# Patient Record
Sex: Male | Born: 1960 | Race: White | Hispanic: No | Marital: Married | Smoking: Former smoker
Health system: Southern US, Community
[De-identification: ages and names within clinical notes are randomized; demographics above are authoritative.]

## PROBLEM LIST (undated history)

## (undated) DIAGNOSIS — E7800 Pure hypercholesterolemia, unspecified: Secondary | ICD-10-CM

## (undated) DIAGNOSIS — I10 Essential (primary) hypertension: Secondary | ICD-10-CM

## (undated) DIAGNOSIS — I471 Supraventricular tachycardia: Secondary | ICD-10-CM

---

## 2005-09-12 ENCOUNTER — Emergency Department: Payer: Self-pay | Admitting: Emergency Medicine

## 2005-09-12 ENCOUNTER — Other Ambulatory Visit: Payer: Self-pay

## 2005-09-23 ENCOUNTER — Ambulatory Visit: Payer: Self-pay | Admitting: Internal Medicine

## 2008-09-09 HISTORY — PX: ROTATOR CUFF REPAIR: SHX139

## 2010-07-14 ENCOUNTER — Emergency Department: Payer: Self-pay | Admitting: Unknown Physician Specialty

## 2011-06-27 ENCOUNTER — Ambulatory Visit: Payer: Self-pay | Admitting: Internal Medicine

## 2011-12-09 ENCOUNTER — Ambulatory Visit: Payer: Self-pay

## 2011-12-09 LAB — DOT URINE DIP
Blood: NEGATIVE
Glucose,UR: NEGATIVE mg/dL (ref 0–75)
Specific Gravity: 1.025 (ref 1.003–1.030)

## 2012-01-27 ENCOUNTER — Encounter (HOSPITAL_COMMUNITY): Payer: Self-pay

## 2012-01-27 ENCOUNTER — Emergency Department (HOSPITAL_COMMUNITY)
Admission: EM | Admit: 2012-01-27 | Discharge: 2012-01-27 | Disposition: A | Discharge status: Home / Self Care | Payer: BC Managed Care – PPO | Attending: Emergency Medicine | Admitting: Emergency Medicine

## 2012-01-27 DIAGNOSIS — I471 Supraventricular tachycardia: Secondary | ICD-10-CM

## 2012-01-27 DIAGNOSIS — Z7982 Long term (current) use of aspirin: Secondary | ICD-10-CM | POA: Insufficient documentation

## 2012-01-27 DIAGNOSIS — E789 Disorder of lipoprotein metabolism, unspecified: Secondary | ICD-10-CM | POA: Insufficient documentation

## 2012-01-27 DIAGNOSIS — Z79899 Other long term (current) drug therapy: Secondary | ICD-10-CM | POA: Insufficient documentation

## 2012-01-27 DIAGNOSIS — I10 Essential (primary) hypertension: Secondary | ICD-10-CM | POA: Insufficient documentation

## 2012-01-27 DIAGNOSIS — R002 Palpitations: Secondary | ICD-10-CM | POA: Insufficient documentation

## 2012-01-27 DIAGNOSIS — I498 Other specified cardiac arrhythmias: Secondary | ICD-10-CM | POA: Insufficient documentation

## 2012-01-27 HISTORY — DX: Pure hypercholesterolemia, unspecified: E78.00

## 2012-01-27 HISTORY — DX: Essential (primary) hypertension: I10

## 2012-01-27 HISTORY — DX: Supraventricular tachycardia: I47.1

## 2012-01-27 LAB — DIFFERENTIAL
Basophils Absolute: 0 10*3/uL (ref 0.0–0.1)
Lymphocytes Relative: 39 % (ref 12–46)
Monocytes Absolute: 0.5 10*3/uL (ref 0.1–1.0)
Neutro Abs: 3.8 10*3/uL (ref 1.7–7.7)

## 2012-01-27 LAB — BASIC METABOLIC PANEL
CO2: 25 mEq/L (ref 19–32)
Chloride: 106 mEq/L (ref 96–112)
Creatinine, Ser: 0.9 mg/dL (ref 0.50–1.35)
Potassium: 4.1 mEq/L (ref 3.5–5.1)
Sodium: 141 mEq/L (ref 135–145)

## 2012-01-27 LAB — CBC
HCT: 46.6 % (ref 39.0–52.0)
RDW: 13 % (ref 11.5–15.5)
WBC: 8.1 10*3/uL (ref 4.0–10.5)

## 2012-01-27 NOTE — ED Notes (Signed)
Pant is AOx4 and comfortable with his discharge instructions.

## 2012-01-27 NOTE — ED Provider Notes (Signed)
History     CSN: [id number]  Arrival date & time 01/27/12  2134   First MD Initiated Contact with Patient 01/27/12 2135      Chief Complaint  Patient presents with  . Tachycardia    (Consider location/radiation/quality/duration/timing/severity/associated sxs/prior treatment) Patient is a 51 y.o. male presenting with palpitations. The history is provided by the patient. No language interpreter was used.  Palpitations  This is a new problem. The current episode started 1 to 2 hours ago. The problem occurs constantly. The problem has been resolved. Pertinent negatives include no diaphoresis, no fever, no malaise/fatigue, no numbness, no chest pain, no chest pressure, no near-syncope, no orthopnea, no abdominal pain, no nausea, no vomiting, no headaches, no back pain, no leg pain, no dizziness, no weakness, no cough and no shortness of breath. He has tried nothing for the symptoms.    Past Medical History  Diagnosis Date  . SVT (supraventricular tachycardia)   . Hypercholesteremia   . Hypertension     Past Surgical History  Procedure Date  . Rotator cuff repair 2010    left    Family History  Problem Relation Age of Onset  . Hypertension Father     History  Substance Use Topics  . Smoking status: Former Smoker -- 3.0 packs/day for 25 years    Quit date: 02/27/1995  . Smokeless tobacco: Never Used  . Alcohol Use: 8.4 oz/week    14 Shots of liquor per week      Review of Systems  Constitutional: Negative for fever, malaise/fatigue, diaphoresis, activity change, appetite change and fatigue.  HENT: Negative for congestion, sore throat, rhinorrhea, neck pain and neck stiffness.   Respiratory: Negative for cough and shortness of breath.   Cardiovascular: Positive for palpitations. Negative for chest pain, orthopnea and near-syncope.  Gastrointestinal: Negative for nausea, vomiting and abdominal pain.  Genitourinary: Negative for dysuria, urgency, frequency and flank pain.   Musculoskeletal: Negative for myalgias, back pain and arthralgias.  Neurological: Negative for dizziness, weakness, light-headedness, numbness and headaches.  All other systems reviewed and are negative.    Allergies  Clindamycin/lincomycin; Penicillins; and Sulfa antibiotics  Home Medications   Current Outpatient Rx  Name Route Sig Dispense Refill  . ASPIRIN 81 MG PO CHEW Oral Chew 324 mg by mouth daily.    Marland Kitchen FLUTICASONE FUROATE 27.5 MCG/SPRAY NA SUSP Nasal Place 2 sprays into the nose daily.    Marland Kitchen METOPROLOL TARTRATE 25 MG PO TABS Oral Take 25 mg by mouth daily.    Marland Kitchen SIMVASTATIN 10 MG PO TABS Oral Take 10 mg by mouth at bedtime.      BP 141/111  Pulse 85  Temp(Src) 98.3 F (36.8 C) (Oral)  Resp 16  Ht 5\' 6"  (1.676 m)  Wt 185 lb (83.915 kg)  BMI 29.86 kg/m2  SpO2 97%  Physical Exam  Nursing note and vitals reviewed. Constitutional: He is oriented to person, place, and time. He appears well-developed and well-nourished. No distress.  HENT:  Head: Normocephalic and atraumatic.  Mouth/Throat: Oropharynx is clear and moist.  Eyes: Conjunctivae and EOM are normal. Pupils are equal, round, and reactive to light.  Neck: Normal range of motion. Neck supple.  Cardiovascular: Normal rate, regular rhythm, normal heart sounds and intact distal pulses.  Exam reveals no gallop and no friction rub.   No murmur heard. Pulmonary/Chest: Effort normal and breath sounds normal. No respiratory distress. He exhibits no tenderness.  Abdominal: Soft. Bowel sounds are normal. There is no tenderness. There  is no rebound and no guarding.  Musculoskeletal: Normal range of motion. He exhibits no tenderness.  Neurological: He is alert and oriented to person, place, and time. No cranial nerve deficit.  Skin: Skin is warm and dry. No rash noted.    ED Course  Procedures (including critical care time)   Date: 01/27/2012  Rate: 85  Rhythm: normal sinus rhythm  QRS Axis: normal  Intervals:  normal  ST/T Wave abnormalities: normal  Conduction Disutrbances:none  Narrative Interpretation:   Old EKG Reviewed: none available  Labs Reviewed  DIFFERENTIAL - Abnormal; Notable for the following:    Eosinophils Relative 7 (*)    All other components within normal limits  CBC  BASIC METABOLIC PANEL   No results found.   1. SVT (supraventricular tachycardia)       MDM  Supraventricular tachycardia which resolved using vagal maneuvers prior to arrival. History of same. Try to followup with primary care physician. The patient is not anemic and has no left leg about his. He will be discharged home with instructions to follow up with primary care physician. He is slightly hypertensive. Struck to followup with primary care physician to discuss further blood pressure control.        Dayton Bailiff, MD 01/27/12 2253

## 2012-01-27 NOTE — Discharge Instructions (Signed)
Supraventricular Tachycardia  Supraventricular tachycardia (SVT) is an abnormal heart rhythm (arrhythmia) that causes the heart to beat very fast (tachycardia). This kind of fast heartbeat originates in the upper chambers of the heart (atria). SVT can cause the heart to beat greater than 100 beats per minute. SVT can have a rapid burst of heartbeats. This can start and stop suddenly without warning and is called nonsustained. SVT can also be sustained, in which the heart beats at a continuous fast rate.   CAUSES   There can be different causes of SVT. Some of these include:   Heart valve problems such as mitral valve prolapse.   An enlarged heart (hypertrophic cardiomyopathy).   Congenital heart problems.   Heart inflammation (pericarditis).   Hyperthyroidism.   Low potassium or magnesium levels.   Caffeine.   Drug use such as cocaine, methamphetamines, or stimulants.   Some over-the-counter medicines such as:   Decongestants.   Diet medicines.   Herbal medicines.  SYMPTOMS   Symptoms of SVT can vary. Symptoms depend on whether the SVT is sustained or nonsustained. You may experience:   No symptoms (asymptomatic).   An awareness of your heart beating rapidly (palpitations).   Shortness of breath.   Chest pain or pressure.  If your blood pressure drops because of the SVT, you may experience:   Fainting or near fainting.   Weakness.   Dizziness.  DIAGNOSIS   Different tests can be performed to diagnose SVT, such as:   An electrocardiogram (EKG). This is a painless test that records the electrical activity of your heart.   Holter monitor. This is a 24 hour recording of your heart rhythm. You will be given a diary. Write down all symptoms that you have and what you were doing at the time you experienced symptoms.   Arrhythmia monitor. This is a small device that your wear for several weeks. It records the heart rhythm when you have symptoms.   Echocardiogram. This is an imaging test to help detect  abnormal heart structure such as congenital abnormalities, heart valve problems, or heart enlargement.   Stress test. This test can help determine if the SVT is related to exercise.   Electrophysiology study (EPS). This is a procedure that evaluates your heart's electrical system and can help your caregiver find the cause of your SVT.  TREATMENT   Treatment of SVT depends on the symptoms, how often it recurs, and whether there are any underlying heart problems.    If symptoms are rare and no other cardiac disease is present, no treatment may be needed.   Blood work may be done to check potassium, magnesium, and thyroid hormone levels to see if they are abnormal. If these levels are abnormal, treatment to correct the problems will occur.  Medicines  Your caregiver may use oral medicines to treat SVT. These medicines are given for long-term control of SVT. Medicines may be used alone or in combination with other treatments. These medicines work to slow nerve impulses in the heart muscle. These medicines can also be used to treat high blood pressure. Some of these medicines may include:   Calcium channel blockers.   Beta blockers.   Digoxin.  Nonsurgical procedures  Nonsurgical techniques may be used if oral medicines do not work. Some examples include:   Cardioversion. This technique uses either drugs or an electrical shock to restore a normal heart rhythm.   Cardioversion drugs may be given through an intravenous (IV) line to help "reset" the   heart rhythm.   In electrical cardioversion, the caregiver shocks your heart to stop its beat for a split second. This helps to reset the heart to a normal rhythm.   Ablation. This procedure is done under mild sedation. High frequency radio wave energy is used to destroy the area of heart tissue responsible for the SVT.  HOME CARE INSTRUCTIONS    Do not smoke.   Only take medicines prescribed by your caregiver. Check with your caregiver before using over-the-counter  medicines.   Check with your caregiver about how much alcohol and caffeine (coffee, tea, colas, or chocolate) you may have.   It is very important to keep all follow-up referrals and appointments in order to properly manage this problem.  SEEK IMMEDIATE MEDICAL CARE IF:   You have dizziness.   You faint or nearly faint.   You have shortness of breath.   You have chest pain or pressure.   You have sudden nausea or vomiting.   You have profuse sweating.   You are concerned about how long your symptoms last.   You are concerned about the frequency of your SVT episodes.  If you have the above symptoms, call your local emergency services (911 in [place] immediately. Do not drive yourself to the hospital.  MAKE SURE YOU:    Understand these instructions.   Will watch your condition.   Will get help right away if you are not doing well or get worse.  Document Released: 08/26/2005 Document Revised: 08/15/2011 Document Reviewed: 12/08/2008  [employer] Patient Information 2012 [employer]

## 2012-01-27 NOTE — ED Notes (Signed)
The patient states that he has had problems with SVT from time to time over the years.  Usually he can convert himself using the valsalva maneuver.  He has needed adenosine once in the past (4 years ago), but he has never needed to be cardioverted.  Today, he complained of palpitations, cold sweats, and 3/10 chest pressure prior to EMS arrival.  He was able to bear down and convert himself.  Post conversion, his chest pressure dissipated.

## 2017-02-18 ENCOUNTER — Emergency Department (HOSPITAL_COMMUNITY): Payer: Medicare HMO

## 2017-02-18 ENCOUNTER — Emergency Department (HOSPITAL_COMMUNITY)
Admission: EM | Admit: 2017-02-18 | Discharge: 2017-02-18 | Disposition: A | Discharge status: Home / Self Care | Payer: Medicare HMO | Attending: Emergency Medicine | Admitting: Emergency Medicine

## 2017-02-18 ENCOUNTER — Encounter (HOSPITAL_COMMUNITY): Payer: Self-pay

## 2017-02-18 DIAGNOSIS — F10120 Alcohol abuse with intoxication, uncomplicated: Secondary | ICD-10-CM | POA: Diagnosis not present

## 2017-02-18 DIAGNOSIS — R4182 Altered mental status, unspecified: Secondary | ICD-10-CM | POA: Diagnosis present

## 2017-02-18 DIAGNOSIS — Z79899 Other long term (current) drug therapy: Secondary | ICD-10-CM | POA: Insufficient documentation

## 2017-02-18 DIAGNOSIS — Z91040 Latex allergy status: Secondary | ICD-10-CM | POA: Diagnosis not present

## 2017-02-18 DIAGNOSIS — Z7984 Long term (current) use of oral hypoglycemic drugs: Secondary | ICD-10-CM | POA: Insufficient documentation

## 2017-02-18 DIAGNOSIS — Z87891 Personal history of nicotine dependence: Secondary | ICD-10-CM | POA: Diagnosis not present

## 2017-02-18 DIAGNOSIS — R791 Abnormal coagulation profile: Secondary | ICD-10-CM | POA: Insufficient documentation

## 2017-02-18 DIAGNOSIS — I10 Essential (primary) hypertension: Secondary | ICD-10-CM | POA: Diagnosis not present

## 2017-02-18 DIAGNOSIS — F10920 Alcohol use, unspecified with intoxication, uncomplicated: Secondary | ICD-10-CM

## 2017-02-18 LAB — COMPREHENSIVE METABOLIC PANEL
ALT: 40 U/L (ref 17–63)
ANION GAP: 11 (ref 5–15)
AST: 31 U/L (ref 15–41)
Albumin: 4.3 g/dL (ref 3.5–5.0)
Alkaline Phosphatase: 84 U/L (ref 38–126)
BILIRUBIN TOTAL: 0.8 mg/dL (ref 0.3–1.2)
BUN: 17 mg/dL (ref 6–20)
CO2: 19 mmol/L — ABNORMAL LOW (ref 22–32)
Calcium: 9 mg/dL (ref 8.9–10.3)
Chloride: 108 mmol/L (ref 101–111)
Creatinine, Ser: 0.77 mg/dL (ref 0.61–1.24)
GFR calc non Af Amer: >60 mL/min (ref >60)
GLUCOSE: 136 mg/dL — AB (ref 65–99)
POTASSIUM: 3.7 mmol/L (ref 3.5–5.1)
Sodium: 138 mmol/L (ref 135–145)
TOTAL PROTEIN: 7 g/dL (ref 6.5–8.1)

## 2017-02-18 LAB — RAPID URINE DRUG SCREEN, HOSP PERFORMED
AMPHETAMINES: NOT DETECTED
BARBITURATES: NOT DETECTED
BENZODIAZEPINES: NOT DETECTED
COCAINE: NOT DETECTED
OPIATES: NOT DETECTED
TETRAHYDROCANNABINOL: NOT DETECTED

## 2017-02-18 LAB — CBC WITH DIFFERENTIAL/PLATELET
Basophils Absolute: 0 10*3/uL (ref 0.0–0.1)
Basophils Relative: 1 %
EOS ABS: 0.3 10*3/uL (ref 0.0–0.7)
Eosinophils Relative: 3 %
HEMATOCRIT: 46.1 % (ref 39.0–52.0)
Hemoglobin: 15.4 g/dL (ref 13.0–17.0)
Lymphocytes Relative: 36 %
Lymphs Abs: 3 10*3/uL (ref 0.7–4.0)
MCH: 28.3 pg (ref 26.0–34.0)
MCHC: 33.4 g/dL (ref 30.0–36.0)
MCV: 84.7 fL (ref 78.0–100.0)
MONO ABS: 0.3 10*3/uL (ref 0.1–1.0)
MONOS PCT: 4 %
NEUTROS ABS: 4.5 10*3/uL (ref 1.7–7.7)
Neutrophils Relative %: 56 %
Platelets: 287 10*3/uL (ref 150–400)
RBC: 5.44 MIL/uL (ref 4.22–5.81)
RDW: 13.3 % (ref 11.5–15.5)
WBC: 8.1 10*3/uL (ref 4.0–10.5)

## 2017-02-18 LAB — URINALYSIS, ROUTINE W REFLEX MICROSCOPIC
BILIRUBIN URINE: NEGATIVE
Bacteria, UA: NONE SEEN
HGB URINE DIPSTICK: NEGATIVE
Ketones, ur: 5 mg/dL — AB
LEUKOCYTES UA: NEGATIVE
NITRITE: NEGATIVE
PH: 5 (ref 5.0–8.0)
Protein, ur: NEGATIVE mg/dL
SQUAMOUS EPITHELIAL / LPF: NONE SEEN
Specific Gravity, Urine: 1.028 (ref 1.005–1.030)

## 2017-02-18 LAB — CBG MONITORING, ED: Glucose-Capillary: 133 mg/dL — ABNORMAL HIGH (ref 65–99)

## 2017-02-18 LAB — I-STAT CG4 LACTIC ACID, ED: LACTIC ACID, VENOUS: 3.05 mmol/L — AB (ref 0.5–1.9)

## 2017-02-18 LAB — AMMONIA: Ammonia: 30 umol/L (ref 9–35)

## 2017-02-18 LAB — PROTIME-INR
INR: 0.95
Prothrombin Time: 12.7 seconds (ref 11.4–15.2)

## 2017-02-18 LAB — ETHANOL: Alcohol, Ethyl (B): 308 mg/dL (ref <5)

## 2017-02-18 MED ORDER — SODIUM CHLORIDE 0.9 % IV BOLUS (SEPSIS)
1000.0000 mL | Freq: Once | INTRAVENOUS | Status: AC
  Administered 2017-02-18: 1000 mL via INTRAVENOUS

## 2017-02-18 MED ORDER — ONDANSETRON HCL 4 MG/2ML IJ SOLN
4.0000 mg | Freq: Once | INTRAMUSCULAR | Status: AC
  Administered 2017-02-18: 4 mg via INTRAVENOUS
  Filled 2017-02-18: qty 2

## 2017-02-18 MED ORDER — NALOXONE HCL 0.4 MG/ML IJ SOLN
0.4000 mg | Freq: Once | INTRAMUSCULAR | Status: AC
  Administered 2017-02-18: 0.4 mg via INTRAVENOUS
  Filled 2017-02-18: qty 1

## 2017-02-18 NOTE — ED Notes (Signed)
Patient began vomiting. Family assisted patient to sitting position.

## 2017-02-18 NOTE — ED Triage Notes (Signed)
Pt brought in by [facility]. Pt here for altered mental status with LSN of 2230. Pt told his wife that he was going to take pain meds and drink a little. Pt wife woke up at 2330 to find pt sitting on floor non responsive. EMS administered 1/2 amp of d50 for BG of 103. Pt non responsive to treatment. Initial GCS of 7.

## 2017-02-18 NOTE — ED Notes (Signed)
Per other RN, patient sat up and began "tearing everything off". At that time patient was oriented x1-2. Reported no recollection of the evenings events, had no idea of where he was, and was mostly disoriented on time.

## 2017-02-18 NOTE — ED Provider Notes (Signed)
[facility]-EMERGENCY DEPT Provider Note   CSN: [id number] Arrival date & time: 02/18/17  0120   By signing my name below, I, Freida Busman, attest that this documentation has been prepared under the direction and in the presence of Pricilla Loveless, MD . Electronically Signed: Freida Busman, Scribe. 02/18/2017. 1:38 AM.   History   Chief Complaint Chief Complaint  Patient presents with  . Altered Mental Status   LEVEL 5 CAVEAT DUE TO AMS   The history is provided by the EMS personnel. No language interpreter was used.     HPI Comments:  Bryan Rowland is a 56 y.o. male who presents to the Emergency Department via EMS for AMS first noticed ~2230. Wife noted to EMS that the pt took an oxycodone for shoulder pain and then told her he was going to have a drink before bed; this is when he was last seen normal by wife. She woke up and found the pt sitting on the floor non-responsive; no signs of injury. EMS reports CBG of 102 and GCS of 7. He was given half an amp of D50 en route without change. Vitals have been stable.   Wife arrived later and notes that the patient has been acting like his normal self throughout the day today. Sometime between 9 and 10:00 tonight she saw [name] was acting normal. However he's been complaining of progressive acute on chronic pain in multiple joints. He was telling her he was going to go take his pain medicine and go to bed. She notes that he frequently will drink alcohol when he is in significant pain. At around 10 or 10:30 she could tell he had been drinking because is acting "silly". Sometime around midnight she heard a loud sound and found him sitting on the floor next to the bed. She thinks she is trying to get in or out of bed. She was able to coax him back into bed but he was very hard to arouse and so she called 911. Otherwise he has not been acting different or having fevers or infectious symptoms.    Past Medical History:  Diagnosis Date  .  Hypercholesteremia   . Hypertension   . SVT (supraventricular tachycardia) (HCC)     There are no active problems to display for this patient.   Past Surgical History:  Procedure Laterality Date  . ROTATOR CUFF REPAIR  2010   left       Home Medications    Prior to Admission medications   Medication Sig Start Date End Date Taking? Authorizing Provider  acetaminophen (TYLENOL) 500 MG tablet Take 500-1,000 mg by mouth every 6 (six) hours as needed for mild pain.   Yes [provider]  fluticasone (VERAMYST) 27.5 MCG/SPRAY nasal spray Place 2 sprays into the nose daily as needed for rhinitis or allergies.    Yes [provider]  glipiZIDE (GLUCOTROL) 5 MG tablet Take by mouth 2 (two) times daily before a meal.   Yes [provider]  ibuprofen (ADVIL,MOTRIN) 200 MG tablet Take 200-800 mg by mouth every 6 (six) hours as needed for moderate pain.    Yes [provider]  lisinopril (PRINIVIL,ZESTRIL) 10 MG tablet Take 10 mg by mouth daily.   Yes [provider]  metFORMIN (GLUCOPHAGE-XR) 500 MG 24 hr tablet Take 1,000 mg by mouth every evening.   Yes [provider]  ondansetron (ZOFRAN) 4 MG tablet Take 4 mg by mouth every 8 (eight) hours as needed for nausea or  vomiting.   Yes [provider]  oxyCODONE (OXY IR/ROXICODONE) 5 MG immediate release tablet Take 5 mg by mouth every 4 (four) hours as needed for severe pain.   Yes [provider]  pravastatin (PRAVACHOL) 20 MG tablet Take 20 mg by mouth daily.   Yes [provider]    Family History Family History  Problem Relation Age of Onset  . Hypertension Father     Social History Social History  Substance Use Topics  . Smoking status: Former Smoker    Packs/day: 3.00    Years: 25.00    Quit date: 02/27/1995  . Smokeless tobacco: Never Used  . Alcohol use 8.4 oz/week    14 Shots of liquor per week     Allergies   Penicillins;  Clindamycin/lincomycin; Sulfa antibiotics; and Latex   Review of Systems Review of Systems  Unable to perform ROS: Mental status change     Physical Exam Updated Vital Signs BP (!) 102/53   Pulse 75   Temp (!) 95.2 F (35.1 C) (Rectal)   Resp (!) 22   SpO2 100%   Physical Exam  Constitutional: He appears well-developed and well-nourished.  HENT:  Head: Normocephalic and atraumatic.  Right Ear: External ear normal.  Left Ear: External ear normal.  Nose: Nose normal.  Eyes: Pupils are equal, round, and reactive to light. Right eye exhibits no discharge. Left eye exhibits no discharge.  pupils midsized and reactive bilaterally  Neck: Neck supple.  Cardiovascular: Normal rate, regular rhythm and normal heart sounds.   Pulmonary/Chest: Effort normal and breath sounds normal.  Abdominal: Soft. There is no tenderness.  Musculoskeletal: He exhibits no edema.  Neurological:  Lethargic, mildly arouses to pain but does not follow commands Moves all 4 extremities spontaneously but unable to do strength testing due to AMS   Skin: Skin is warm and dry.  Nursing note and vitals reviewed.    ED Treatments / Results  DIAGNOSTIC STUDIES:  Oxygen Saturation is 99% on [place], normal by my interpretation.     Labs (all labs ordered are listed, but only abnormal results are displayed) Labs Reviewed  COMPREHENSIVE METABOLIC PANEL - Abnormal; Notable for the following:       Result Value   CO2 19 (*)    Glucose, Bld 136 (*)    All other components within normal limits  URINALYSIS, ROUTINE W REFLEX MICROSCOPIC - Abnormal; Notable for the following:    APPearance HAZY (*)    Glucose, UA >=500 (*)    Ketones, ur 5 (*)    All other components within normal limits  ETHANOL - Abnormal; Notable for the following:    Alcohol, Ethyl (B) 308 (*)    All other components within normal limits  CBG MONITORING, ED - Abnormal; Notable for the following:    Glucose-Capillary 133 (*)    All other  components within normal limits  I-STAT CG4 LACTIC ACID, ED - Abnormal; Notable for the following:    Lactic Acid, Venous 3.05 (*)    All other components within normal limits  CBC WITH DIFFERENTIAL/PLATELET  PROTIME-INR  AMMONIA  RAPID URINE DRUG SCREEN, HOSP PERFORMED    EKG  EKG Interpretation  Date/Time:  Tuesday February 18 2017 01:27:58 EDT Ventricular Rate:  72 PR Interval:    QRS Duration: 111 QT Interval:  387 QTC Calculation: 424 R Axis:   22 Text Interpretation:  Sinus rhythm Incomplete left bundle branch block no significant change since May 2013 Confirmed by Criss AlvineGoldston,  Brandalyn Harting [phone]) on 02/18/2017 1:42:52 AM       Radiology Dg Chest 1 View  Result Date: 02/18/2017 CLINICAL DATA:  Acute onset of altered mental status. Initial encounter. EXAM: CHEST 1 VIEW COMPARISON:  Chest radiograph performed 09/12/2005 FINDINGS: The lungs are well-aerated and clear. There is no evidence of focal opacification, pleural effusion or pneumothorax. The cardiomediastinal silhouette is mildly enlarged. No acute osseous abnormalities are seen. IMPRESSION: Mild cardiomegaly.  Lungs remain grossly clear. Electronically Signed   By: Roanna Raider M.D.   On: 02/18/2017 02:30   Ct Head Wo Contrast  Result Date: 02/18/2017 CLINICAL DATA:  Found down, alcohol intoxication. Last seen normal at 2200 hours. History of hypertension and hypercholesterolemia. EXAM: CT HEAD WITHOUT CONTRAST CT CERVICAL SPINE WITHOUT CONTRAST TECHNIQUE: Multidetector CT imaging of the head and cervical spine was performed following the standard protocol without intravenous contrast. Multiplanar CT image reconstructions of the cervical spine were also generated. COMPARISON:  MRI of the head Jan 30, 2012 FINDINGS: CT HEAD FINDINGS BRAIN: No intraparenchymal hemorrhage, mass effect nor midline shift. Mild prominence of ventricle and sulci for patient's age. No acute large vascular territory infarcts. Patchy RIGHT parietal  hypodensities stable from prior MRI given differences in imaging technique. No abnormal extra-axial fluid collections. Basal cisterns are patent. VASCULAR: Trace calcific atherosclerosis of the carotid siphons. SKULL/SOFT TISSUES: No skull fracture. No significant soft tissue swelling. ORBITS/SINUSES: The included ocular globes and orbital contents are normal.Moderate paranasal sinus mucosal thickening without air-fluid levels. The mastoid air cells are well aerated. OTHER: None. CT CERVICAL SPINE FINDINGS- moderately motion degraded examination. ALIGNMENT: Maintained lordosis. Vertebral bodies in alignment. SKULL BASE AND VERTEBRAE: Cervical vertebral bodies and posterior elements are intact. Severe is C3-4 disc height loss with uncovertebral hypertrophy and endplate spurring compatible with degenerative disc. No destructive bony lesions. C1-2 articulation maintained. SOFT TISSUES AND SPINAL CANAL: Normal. DISC LEVELS: No significant osseous canal stenosis. Moderate suspected C3-4, moderate to severe C5-6 neural foraminal narrowing. UPPER CHEST: Lung apices are clear. OTHER: None. IMPRESSION: CT HEAD: No acute intracranial process. Mild parenchymal brain volume loss for age. Stable focal RIGHT parietal chronic small vessel ischemic disease. CT CERVICAL SPINE: No acute fracture or malalignment on this motion degraded examination. Electronically Signed   By: Awilda Metro M.D.   On: 02/18/2017 02:40   Ct Cervical Spine Wo Contrast  Result Date: 02/18/2017 CLINICAL DATA:  Found down, alcohol intoxication. Last seen normal at 2200 hours. History of hypertension and hypercholesterolemia. EXAM: CT HEAD WITHOUT CONTRAST CT CERVICAL SPINE WITHOUT CONTRAST TECHNIQUE: Multidetector CT imaging of the head and cervical spine was performed following the standard protocol without intravenous contrast. Multiplanar CT image reconstructions of the cervical spine were also generated. COMPARISON:  MRI of the head Jan 30, 2012  FINDINGS: CT HEAD FINDINGS BRAIN: No intraparenchymal hemorrhage, mass effect nor midline shift. Mild prominence of ventricle and sulci for patient's age. No acute large vascular territory infarcts. Patchy RIGHT parietal hypodensities stable from prior MRI given differences in imaging technique. No abnormal extra-axial fluid collections. Basal cisterns are patent. VASCULAR: Trace calcific atherosclerosis of the carotid siphons. SKULL/SOFT TISSUES: No skull fracture. No significant soft tissue swelling. ORBITS/SINUSES: The included ocular globes and orbital contents are normal.Moderate paranasal sinus mucosal thickening without air-fluid levels. The mastoid air cells are well aerated. OTHER: None. CT CERVICAL SPINE FINDINGS- moderately motion degraded examination. ALIGNMENT: Maintained lordosis. Vertebral bodies in alignment. SKULL BASE AND VERTEBRAE: Cervical vertebral bodies and posterior elements are intact. Severe is C3-4  disc height loss with uncovertebral hypertrophy and endplate spurring compatible with degenerative disc. No destructive bony lesions. C1-2 articulation maintained. SOFT TISSUES AND SPINAL CANAL: Normal. DISC LEVELS: No significant osseous canal stenosis. Moderate suspected C3-4, moderate to severe C5-6 neural foraminal narrowing. UPPER CHEST: Lung apices are clear. OTHER: None. IMPRESSION: CT HEAD: No acute intracranial process. Mild parenchymal brain volume loss for age. Stable focal RIGHT parietal chronic small vessel ischemic disease. CT CERVICAL SPINE: No acute fracture or malalignment on this motion degraded examination. Electronically Signed   By: Awilda Metro M.D.   On: 02/18/2017 02:40    Procedures Procedures (including critical care time)  Medications Ordered in ED Medications  naloxone [facility]) injection 0.4 mg (0.4 mg Intravenous Given 02/18/17 0214)  sodium chloride 0.9 % bolus 1,000 mL (0 mLs Intravenous Stopped 02/18/17 0544)  sodium chloride 0.9 % bolus 1,000 mL (0  mLs Intravenous Stopped 02/18/17 0544)  ondansetron (ZOFRAN) injection 4 mg (4 mg Intravenous Given 02/18/17 0559)     Initial Impression / Assessment and Plan / ED Course  I have reviewed the triage vital signs and the nursing notes.  Pertinent labs & imaging results that were available during my care of the patient were reviewed by me and considered in my medical decision making (see chart for details).  Clinical Course as of Feb 18 958  Tue Feb 18, 2017  0140 patient has no focal neurologic deficits besides the acute change in mental status. Given the report of narcotic and alcohol use this is probably the cause. However, with such depressed mental status, will get emergent CT of head and C-spine as her could've been a fall. Given no focal neurologic deficits otherwise this is not indicative of a code stroke.  [SG]    Clinical Course User Index [SG] Pricilla Loveless, MD    Patient has progressively improved throughout the night. Son tells me he noticed a new 1/2 pint of vodka and whiskey gone tonight. Appears to have had severe ETOH intoxication. No evidence of trauma. Now coming back to himself. Still intoxicated. Patient is still unsteady on feet and needs assistance to walk. Discussed with wife/son, they are comfortable taking him home and helping him get to bed. He was hypothermic on arrival and lactate 3. This appears to be related to the acute ETOH. No signs/symptoms of acute infection.   Final Clinical Impressions(s) / ED Diagnoses   Final diagnoses:  Alcoholic intoxication without complication [facility])    New Prescriptions Discharge Medication List as of 02/18/2017  7:50 AM     I personally performed the services described in this documentation, which was scribed in my presence. The recorded information has been reviewed and is accurate.     Pricilla Loveless, MD 02/18/17 1000

## 2019-03-02 ENCOUNTER — Telehealth: Payer: Self-pay | Admitting: *Deleted

## 2019-03-02 NOTE — Telephone Encounter (Signed)
Called to review the [facility] program. LMOM 

## 2020-01-21 ENCOUNTER — Inpatient Hospital Stay (HOSPITAL_COMMUNITY)
Admission: EM | Admit: 2020-01-21 | Discharge: 2020-01-24 | DRG: 246 | Disposition: A | Discharge status: Home / Self Care | Payer: Medicare HMO | Attending: Internal Medicine | Admitting: Internal Medicine

## 2020-01-21 ENCOUNTER — Inpatient Hospital Stay (HOSPITAL_COMMUNITY)
Admission: EM | Disposition: A | Discharge status: Home / Self Care | Payer: Self-pay | Source: Home / Self Care | Attending: Internal Medicine

## 2020-01-21 ENCOUNTER — Ambulatory Visit (HOSPITAL_COMMUNITY): Admit: 2020-01-21 | Payer: Medicare HMO | Admitting: Internal Medicine

## 2020-01-21 DIAGNOSIS — E1159 Type 2 diabetes mellitus with other circulatory complications: Secondary | ICD-10-CM | POA: Diagnosis not present

## 2020-01-21 DIAGNOSIS — Z955 Presence of coronary angioplasty implant and graft: Secondary | ICD-10-CM | POA: Diagnosis not present

## 2020-01-21 DIAGNOSIS — I2121 ST elevation (STEMI) myocardial infarction involving left circumflex coronary artery: Secondary | ICD-10-CM | POA: Diagnosis present

## 2020-01-21 DIAGNOSIS — Z880 Allergy status to penicillin: Secondary | ICD-10-CM | POA: Diagnosis not present

## 2020-01-21 DIAGNOSIS — Z87891 Personal history of nicotine dependence: Secondary | ICD-10-CM | POA: Diagnosis not present

## 2020-01-21 DIAGNOSIS — Z7289 Other problems related to lifestyle: Secondary | ICD-10-CM

## 2020-01-21 DIAGNOSIS — I10 Essential (primary) hypertension: Secondary | ICD-10-CM | POA: Diagnosis present

## 2020-01-21 DIAGNOSIS — E119 Type 2 diabetes mellitus without complications: Secondary | ICD-10-CM | POA: Diagnosis present

## 2020-01-21 DIAGNOSIS — Z7951 Long term (current) use of inhaled steroids: Secondary | ICD-10-CM | POA: Diagnosis not present

## 2020-01-21 DIAGNOSIS — Z20822 Contact with and (suspected) exposure to covid-19: Secondary | ICD-10-CM | POA: Diagnosis present

## 2020-01-21 DIAGNOSIS — Z881 Allergy status to other antibiotic agents status: Secondary | ICD-10-CM | POA: Diagnosis not present

## 2020-01-21 DIAGNOSIS — Z79899 Other long term (current) drug therapy: Secondary | ICD-10-CM

## 2020-01-21 DIAGNOSIS — Y831 Surgical operation with implant of artificial internal device as the cause of abnormal reaction of the patient, or of later complication, without mention of misadventure at the time of the procedure: Secondary | ICD-10-CM | POA: Diagnosis present

## 2020-01-21 DIAGNOSIS — Z79890 Hormone replacement therapy (postmenopausal): Secondary | ICD-10-CM | POA: Diagnosis not present

## 2020-01-21 DIAGNOSIS — T82867A Thrombosis of cardiac prosthetic devices, implants and grafts, initial encounter: Principal | ICD-10-CM | POA: Diagnosis present

## 2020-01-21 DIAGNOSIS — Z91040 Latex allergy status: Secondary | ICD-10-CM | POA: Diagnosis not present

## 2020-01-21 DIAGNOSIS — I255 Ischemic cardiomyopathy: Secondary | ICD-10-CM | POA: Diagnosis present

## 2020-01-21 DIAGNOSIS — E782 Mixed hyperlipidemia: Secondary | ICD-10-CM | POA: Diagnosis present

## 2020-01-21 DIAGNOSIS — I2510 Atherosclerotic heart disease of native coronary artery without angina pectoris: Secondary | ICD-10-CM | POA: Diagnosis present

## 2020-01-21 DIAGNOSIS — I252 Old myocardial infarction: Secondary | ICD-10-CM | POA: Diagnosis not present

## 2020-01-21 DIAGNOSIS — Z882 Allergy status to sulfonamides status: Secondary | ICD-10-CM | POA: Diagnosis not present

## 2020-01-21 DIAGNOSIS — E785 Hyperlipidemia, unspecified: Secondary | ICD-10-CM

## 2020-01-21 DIAGNOSIS — I213 ST elevation (STEMI) myocardial infarction of unspecified site: Secondary | ICD-10-CM | POA: Diagnosis present

## 2020-01-21 DIAGNOSIS — E118 Type 2 diabetes mellitus with unspecified complications: Secondary | ICD-10-CM | POA: Diagnosis not present

## 2020-01-21 DIAGNOSIS — I25118 Atherosclerotic heart disease of native coronary artery with other forms of angina pectoris: Secondary | ICD-10-CM | POA: Diagnosis not present

## 2020-01-21 DIAGNOSIS — Z8249 Family history of ischemic heart disease and other diseases of the circulatory system: Secondary | ICD-10-CM | POA: Diagnosis not present

## 2020-01-21 HISTORY — PX: LEFT HEART CATH AND CORONARY ANGIOGRAPHY: CATH118249

## 2020-01-21 HISTORY — PX: INTRAVASCULAR ULTRASOUND/IVUS: CATH118244

## 2020-01-21 HISTORY — PX: CORONARY/GRAFT ACUTE MI REVASCULARIZATION: CATH118305

## 2020-01-21 LAB — APTT: aPTT: 29 seconds (ref 24–36)

## 2020-01-21 LAB — CBC
HCT: 52.9 % — ABNORMAL HIGH (ref 39.0–52.0)
Hemoglobin: 17.6 g/dL — ABNORMAL HIGH (ref 13.0–17.0)
MCH: 28.3 pg (ref 26.0–34.0)
MCHC: 33.3 g/dL (ref 30.0–36.0)
MCV: 85 fL (ref 80.0–100.0)
Platelets: 357 10*3/uL (ref 150–400)
RBC: 6.22 MIL/uL — ABNORMAL HIGH (ref 4.22–5.81)
RDW: 12.1 % (ref 11.5–15.5)
WBC: 16.3 10*3/uL — ABNORMAL HIGH (ref 4.0–10.5)
nRBC: 0 % (ref 0.0–0.2)

## 2020-01-21 LAB — POCT I-STAT 7, (LYTES, BLD GAS, ICA,H+H)
Acid-base deficit: 8 mmol/L — ABNORMAL HIGH (ref 0.0–2.0)
Bicarbonate: 18.1 mmol/L — ABNORMAL LOW (ref 20.0–28.0)
Calcium, Ion: 1.17 mmol/L (ref 1.15–1.40)
HCT: 51 % (ref 39.0–52.0)
Hemoglobin: 17.3 g/dL — ABNORMAL HIGH (ref 13.0–17.0)
O2 Saturation: 83 %
Potassium: 3.2 mmol/L — ABNORMAL LOW (ref 3.5–5.1)
Sodium: 133 mmol/L — ABNORMAL LOW (ref 135–145)
TCO2: 19 mmol/L — ABNORMAL LOW (ref 22–32)
pCO2 arterial: 39.3 mmHg (ref 32.0–48.0)
pH, Arterial: 7.272 — ABNORMAL LOW (ref 7.350–7.450)
pO2, Arterial: 53 mmHg — ABNORMAL LOW (ref 83.0–108.0)

## 2020-01-21 LAB — SARS CORONAVIRUS 2 BY RT PCR (HOSPITAL ORDER, PERFORMED IN ~~LOC~~ HOSPITAL LAB): SARS Coronavirus 2: NEGATIVE

## 2020-01-21 LAB — COMPREHENSIVE METABOLIC PANEL WITH GFR
ALT: 27 U/L (ref 0–44)
AST: 30 U/L (ref 15–41)
Albumin: 4.4 g/dL (ref 3.5–5.0)
Alkaline Phosphatase: 83 U/L (ref 38–126)
Anion gap: 19 — ABNORMAL HIGH (ref 5–15)
BUN: 29 mg/dL — ABNORMAL HIGH (ref 6–20)
CO2: 13 mmol/L — ABNORMAL LOW (ref 22–32)
Calcium: 9 mg/dL (ref 8.9–10.3)
Chloride: 105 mmol/L (ref 98–111)
Creatinine, Ser: 1.18 mg/dL (ref 0.61–1.24)
GFR calc Af Amer: >60 mL/min
GFR calc non Af Amer: >60 mL/min
Glucose, Bld: 215 mg/dL — ABNORMAL HIGH (ref 70–99)
Potassium: 4 mmol/L (ref 3.5–5.1)
Sodium: 137 mmol/L (ref 135–145)
Total Bilirubin: 1 mg/dL (ref 0.3–1.2)
Total Protein: 7.3 g/dL (ref 6.5–8.1)

## 2020-01-21 LAB — POCT ACTIVATED CLOTTING TIME
Activated Clotting Time: 230 seconds
Activated Clotting Time: 280 seconds
Activated Clotting Time: 268 seconds
Activated Clotting Time: 296 seconds

## 2020-01-21 LAB — LIPID PANEL
Cholesterol: 157 mg/dL (ref 0–200)
HDL: 72 mg/dL
LDL Cholesterol: 62 mg/dL (ref 0–99)
Total CHOL/HDL Ratio: 2.2 ratio
Triglycerides: 116 mg/dL
VLDL: 23 mg/dL (ref 0–40)

## 2020-01-21 LAB — HEMOGLOBIN A1C
Hgb A1c MFr Bld: 7.8 % — ABNORMAL HIGH (ref 4.8–5.6)
Mean Plasma Glucose: 177.16 mg/dL

## 2020-01-21 LAB — TROPONIN I (HIGH SENSITIVITY)
Troponin I (High Sensitivity): 11 ng/L (ref <18)
Troponin I (High Sensitivity): >27000 ng/L (ref <18)

## 2020-01-21 LAB — PROTIME-INR
INR: 1 (ref 0.8–1.2)
Prothrombin Time: 12.3 seconds (ref 11.4–15.2)

## 2020-01-21 LAB — POCT I-STAT, CHEM 8
BUN: 28 mg/dL — ABNORMAL HIGH (ref 6–20)
Calcium, Ion: 1.14 mmol/L — ABNORMAL LOW (ref 1.15–1.40)
Chloride: 108 mmol/L (ref 98–111)
Creatinine, Ser: 1 mg/dL (ref 0.61–1.24)
Glucose, Bld: 216 mg/dL — ABNORMAL HIGH (ref 70–99)
HCT: 54 % — ABNORMAL HIGH (ref 39.0–52.0)
Hemoglobin: 18.4 g/dL — ABNORMAL HIGH (ref 13.0–17.0)
Potassium: 3.7 mmol/L (ref 3.5–5.1)
Sodium: 139 mmol/L (ref 135–145)
TCO2: 16 mmol/L — ABNORMAL LOW (ref 22–32)

## 2020-01-21 LAB — GLUCOSE, CAPILLARY: Glucose-Capillary: 219 mg/dL — ABNORMAL HIGH (ref 70–99)

## 2020-01-21 SURGERY — CORONARY/GRAFT ACUTE MI REVASCULARIZATION
Anesthesia: LOCAL

## 2020-01-21 MED ORDER — NITROGLYCERIN 1 MG/10 ML FOR IR/CATH LAB
INTRA_ARTERIAL | Status: DC | PRN
  Administered 2020-01-21: 200 ug

## 2020-01-21 MED ORDER — VERAPAMIL HCL 2.5 MG/ML IV SOLN
INTRAVENOUS | Status: AC
  Filled 2020-01-21: qty 2

## 2020-01-21 MED ORDER — SODIUM CHLORIDE 0.9 % IV SOLN
INTRAVENOUS | Status: DC | PRN
  Administered 2020-01-21: 10 mL/h via INTRAVENOUS

## 2020-01-21 MED ORDER — HEPARIN SODIUM (PORCINE) 1000 UNIT/ML IJ SOLN
INTRAMUSCULAR | Status: AC
  Filled 2020-01-21: qty 1

## 2020-01-21 MED ORDER — IOHEXOL 350 MG/ML SOLN
INTRAVENOUS | Status: DC | PRN
  Administered 2020-01-21: 230 mL

## 2020-01-21 MED ORDER — HEPARIN (PORCINE) IN NACL 1000-0.9 UT/500ML-% IV SOLN
INTRAVENOUS | Status: AC
  Filled 2020-01-21: qty 500

## 2020-01-21 MED ORDER — LABETALOL HCL 5 MG/ML IV SOLN: 10.0000 mg | INTRAVENOUS | Status: AC | PRN

## 2020-01-21 MED ORDER — HEPARIN SODIUM (PORCINE) 1000 UNIT/ML IJ SOLN
INTRAMUSCULAR | Status: DC | PRN
  Administered 2020-01-21: 2000 [IU] via INTRAVENOUS
  Administered 2020-01-21: 3000 [IU] via INTRAVENOUS
  Administered 2020-01-21: 7000 [IU] via INTRAVENOUS
  Administered 2020-01-21: 3000 [IU] via INTRAVENOUS

## 2020-01-21 MED ORDER — VERAPAMIL HCL 2.5 MG/ML IV SOLN
INTRAVENOUS | Status: DC | PRN
  Administered 2020-01-21: 10 mL via INTRA_ARTERIAL

## 2020-01-21 MED ORDER — HEPARIN (PORCINE) IN NACL 1000-0.9 UT/500ML-% IV SOLN
INTRAVENOUS | Status: DC | PRN
  Administered 2020-01-21 (×2): 500 mL

## 2020-01-21 MED ORDER — LIDOCAINE HCL (PF) 1 % IJ SOLN
INTRAMUSCULAR | Status: AC
  Filled 2020-01-21: qty 30

## 2020-01-21 MED ORDER — ONDANSETRON HCL 4 MG/2ML IJ SOLN: 4.0000 mg | Freq: Four times a day (QID) | INTRAMUSCULAR | Status: DC | PRN

## 2020-01-21 MED ORDER — ENOXAPARIN SODIUM 40 MG/0.4ML ~~LOC~~ SOLN
40.0000 mg | SUBCUTANEOUS | Status: DC
  Administered 2020-01-22 – 2020-01-23 (×2): 40 mg via SUBCUTANEOUS
  Filled 2020-01-21 (×3): qty 0.4

## 2020-01-21 MED ORDER — METOPROLOL SUCCINATE 12.5 MG HALF TABLET
12.5000 mg | ORAL_TABLET | Freq: Every day | ORAL | Status: DC
  Administered 2020-01-21 – 2020-01-22 (×2): 12.5 mg via ORAL
  Filled 2020-01-21 (×2): qty 1

## 2020-01-21 MED ORDER — FUROSEMIDE 10 MG/ML IJ SOLN
INTRAMUSCULAR | Status: AC
  Filled 2020-01-21: qty 4

## 2020-01-21 MED ORDER — FUROSEMIDE 10 MG/ML IJ SOLN
INTRAMUSCULAR | Status: DC | PRN
  Administered 2020-01-21: 20 mg via INTRAVENOUS

## 2020-01-21 MED ORDER — SODIUM CHLORIDE 0.9 % IV SOLN: INTRAVENOUS | Status: AC

## 2020-01-21 MED ORDER — MIDAZOLAM HCL 2 MG/2ML IJ SOLN
INTRAMUSCULAR | Status: DC | PRN
  Administered 2020-01-21 (×2): 1 mg via INTRAVENOUS

## 2020-01-21 MED ORDER — IOHEXOL 350 MG/ML SOLN
INTRAVENOUS | Status: AC
  Filled 2020-01-21: qty 1

## 2020-01-21 MED ORDER — ATORVASTATIN CALCIUM 80 MG PO TABS
80.0000 mg | ORAL_TABLET | Freq: Every day | ORAL | Status: DC
  Administered 2020-01-21 – 2020-01-24 (×4): 80 mg via ORAL
  Filled 2020-01-21 (×4): qty 1

## 2020-01-21 MED ORDER — CLOPIDOGREL BISULFATE 75 MG PO TABS
75.0000 mg | ORAL_TABLET | Freq: Every day | ORAL | Status: DC
  Administered 2020-01-22 – 2020-01-24 (×3): 75 mg via ORAL
  Filled 2020-01-21 (×3): qty 1

## 2020-01-21 MED ORDER — ASPIRIN 81 MG PO CHEW
81.0000 mg | CHEWABLE_TABLET | Freq: Every day | ORAL | Status: DC
  Administered 2020-01-22 – 2020-01-24 (×3): 81 mg via ORAL
  Filled 2020-01-21 (×3): qty 1

## 2020-01-21 MED ORDER — SODIUM CHLORIDE 0.9 % IV SOLN: 250.0000 mL | INTRAVENOUS | Status: DC | PRN

## 2020-01-21 MED ORDER — MIDAZOLAM HCL 2 MG/2ML IJ SOLN
INTRAMUSCULAR | Status: AC
  Filled 2020-01-21: qty 2

## 2020-01-21 MED ORDER — NITROGLYCERIN 1 MG/10 ML FOR IR/CATH LAB
INTRA_ARTERIAL | Status: AC
  Filled 2020-01-21: qty 10

## 2020-01-21 MED ORDER — INSULIN ASPART 100 UNIT/ML ~~LOC~~ SOLN
0.0000 [IU] | Freq: Every day | SUBCUTANEOUS | Status: DC
  Administered 2020-01-21: 2 [IU] via SUBCUTANEOUS

## 2020-01-21 MED ORDER — CLOPIDOGREL BISULFATE 300 MG PO TABS
ORAL_TABLET | ORAL | Status: DC | PRN
  Administered 2020-01-21: 300 mg via ORAL

## 2020-01-21 MED ORDER — SODIUM CHLORIDE 0.9% FLUSH
3.0000 mL | Freq: Two times a day (BID) | INTRAVENOUS | Status: DC
  Administered 2020-01-21 – 2020-01-24 (×5): 3 mL via INTRAVENOUS

## 2020-01-21 MED ORDER — ACETAMINOPHEN 325 MG PO TABS
650.0000 mg | ORAL_TABLET | ORAL | Status: DC | PRN
  Administered 2020-01-22: 650 mg via ORAL
  Filled 2020-01-21: qty 2

## 2020-01-21 MED ORDER — SODIUM CHLORIDE 0.9 % IV SOLN
INTRAVENOUS | Status: AC | PRN
  Administered 2020-01-21: 10 mL/h via INTRAVENOUS

## 2020-01-21 MED ORDER — HYDRALAZINE HCL 20 MG/ML IJ SOLN: 10.0000 mg | INTRAMUSCULAR | Status: AC | PRN

## 2020-01-21 MED ORDER — ORAL CARE MOUTH RINSE
15.0000 mL | Freq: Two times a day (BID) | OROMUCOSAL | Status: DC
  Administered 2020-01-22 – 2020-01-23 (×3): 15 mL via OROMUCOSAL

## 2020-01-21 MED ORDER — FENTANYL CITRATE (PF) 100 MCG/2ML IJ SOLN
INTRAMUSCULAR | Status: AC
  Filled 2020-01-21: qty 2

## 2020-01-21 MED ORDER — ALUM & MAG HYDROXIDE-SIMETH 200-200-20 MG/5ML PO SUSP
30.0000 mL | ORAL | Status: DC | PRN
  Administered 2020-01-21: 30 mL via ORAL
  Filled 2020-01-21: qty 30

## 2020-01-21 MED ORDER — FENTANYL CITRATE (PF) 100 MCG/2ML IJ SOLN
INTRAMUSCULAR | Status: DC | PRN
  Administered 2020-01-21 (×2): 25 ug via INTRAVENOUS

## 2020-01-21 MED ORDER — INSULIN ASPART 100 UNIT/ML ~~LOC~~ SOLN
0.0000 [IU] | Freq: Three times a day (TID) | SUBCUTANEOUS | Status: DC
  Administered 2020-01-22: 5 [IU] via SUBCUTANEOUS
  Administered 2020-01-22: 8 [IU] via SUBCUTANEOUS
  Administered 2020-01-22: 5 [IU] via SUBCUTANEOUS
  Administered 2020-01-23 – 2020-01-24 (×4): 3 [IU] via SUBCUTANEOUS

## 2020-01-21 MED ORDER — SODIUM CHLORIDE 0.9% FLUSH: 3.0000 mL | INTRAVENOUS | Status: DC | PRN

## 2020-01-21 SURGICAL SUPPLY — 28 items
BALLN SAPPHIRE 2.0X12 (BALLOONS) ×2
BALLN SAPPHIRE 2.5X12 (BALLOONS) ×2
BALLN SAPPHIRE ~~LOC~~ 2.25X12 (BALLOONS) ×2 IMPLANT
BALLN SAPPHIRE ~~LOC~~ 3.0X12 (BALLOONS) ×2 IMPLANT
BALLN SAPPHIRE ~~LOC~~ 3.5X15 (BALLOONS) ×2 IMPLANT
BALLN SAPPHIRE ~~LOC~~ 4.0X12 (BALLOONS) ×2 IMPLANT
BALLN ~~LOC~~ EMERGE MR 3.5X20 (BALLOONS) ×2
BALLOON SAPPHIRE 2.0X12 (BALLOONS) ×1 IMPLANT
BALLOON SAPPHIRE 2.5X12 (BALLOONS) ×1 IMPLANT
BALLOON ~~LOC~~ EMERGE MR 3.5X20 (BALLOONS) ×1 IMPLANT
CATH 5FR JL3.5 JR4 ANG PIG MP (CATHETERS) ×2 IMPLANT
CATH LAUNCHER 6FR EBU3.5 (CATHETERS) ×2 IMPLANT
CATH OPTICROSS HD (CATHETERS) ×2 IMPLANT
DEVICE RAD COMP TR BAND LRG (VASCULAR PRODUCTS) ×2 IMPLANT
GLIDESHEATH SLEND SS 6F .021 (SHEATH) ×2 IMPLANT
GUIDEWIRE INQWIRE 1.5J.035X260 (WIRE) ×1 IMPLANT
INQWIRE 1.5J .035X260CM (WIRE) ×2
KIT ENCORE 26 ADVANTAGE (KITS) ×4 IMPLANT
KIT HEART LEFT (KITS) ×2 IMPLANT
PACK CARDIAC CATHETERIZATION (CUSTOM PROCEDURE TRAY) ×2 IMPLANT
SLED PULL BACK IVUS (MISCELLANEOUS) ×2 IMPLANT
STENT SYNERGY XD 3.50X20 (Permanent Stent) ×1 IMPLANT
SYNERGY XD 3.50X20 (Permanent Stent) ×2 IMPLANT
TRANSDUCER W/STOPCOCK (MISCELLANEOUS) ×2 IMPLANT
TUBING CIL FLEX 10 FLL-RA (TUBING) ×2 IMPLANT
WIRE ASAHI PROWATER 180CM (WIRE) ×2 IMPLANT
WIRE HI TORQ WHISPER MS 190CM (WIRE) ×2 IMPLANT
WIRE RUNTHROUGH .014X180CM (WIRE) ×2 IMPLANT
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
## 2020-01-22 ENCOUNTER — Inpatient Hospital Stay (HOSPITAL_COMMUNITY): Payer: Medicare HMO

## 2020-01-22 DIAGNOSIS — I2121 ST elevation (STEMI) myocardial infarction involving left circumflex coronary artery: Secondary | ICD-10-CM

## 2020-01-22 LAB — BASIC METABOLIC PANEL
Anion gap: 13 (ref 5–15)
BUN: 26 mg/dL — ABNORMAL HIGH (ref 6–20)
CO2: 19 mmol/L — ABNORMAL LOW (ref 22–32)
Calcium: 8.9 mg/dL (ref 8.9–10.3)
Chloride: 102 mmol/L (ref 98–111)
Creatinine, Ser: 1.2 mg/dL (ref 0.61–1.24)
GFR calc Af Amer: >60 mL/min (ref >60)
GFR calc non Af Amer: >60 mL/min (ref >60)
Glucose, Bld: 188 mg/dL — ABNORMAL HIGH (ref 70–99)
Potassium: 4.4 mmol/L (ref 3.5–5.1)
Sodium: 134 mmol/L — ABNORMAL LOW (ref 135–145)

## 2020-01-22 LAB — ECHOCARDIOGRAM COMPLETE

## 2020-01-22 LAB — CBC
HCT: 52.3 % — ABNORMAL HIGH (ref 39.0–52.0)
Hemoglobin: 16.9 g/dL (ref 13.0–17.0)
MCH: 27.9 pg (ref 26.0–34.0)
MCHC: 32.3 g/dL (ref 30.0–36.0)
MCV: 86.4 fL (ref 80.0–100.0)
Platelets: 331 10*3/uL (ref 150–400)
RBC: 6.05 MIL/uL — ABNORMAL HIGH (ref 4.22–5.81)
RDW: 12.6 % (ref 11.5–15.5)
WBC: 16.6 10*3/uL — ABNORMAL HIGH (ref 4.0–10.5)
nRBC: 0 % (ref 0.0–0.2)

## 2020-01-22 LAB — TROPONIN I (HIGH SENSITIVITY)
Troponin I (High Sensitivity): >27000 ng/L (ref <18)
Troponin I (High Sensitivity): >27000 ng/L (ref <18)

## 2020-01-22 LAB — GLUCOSE, CAPILLARY
Glucose-Capillary: 205 mg/dL — ABNORMAL HIGH (ref 70–99)
Glucose-Capillary: 289 mg/dL — ABNORMAL HIGH (ref 70–99)
Glucose-Capillary: 245 mg/dL — ABNORMAL HIGH (ref 70–99)
Glucose-Capillary: 152 mg/dL — ABNORMAL HIGH (ref 70–99)

## 2020-01-22 LAB — MRSA PCR SCREENING: MRSA by PCR: NEGATIVE

## 2020-01-22 MED ORDER — CHLORHEXIDINE GLUCONATE CLOTH 2 % EX PADS
6.0000 | MEDICATED_PAD | Freq: Every day | CUTANEOUS | Status: DC
  Administered 2020-01-22: 6 via TOPICAL

## 2020-01-22 MED ORDER — LOSARTAN POTASSIUM 25 MG PO TABS
12.5000 mg | ORAL_TABLET | Freq: Every day | ORAL | Status: DC
  Administered 2020-01-22 – 2020-01-24 (×3): 12.5 mg via ORAL
  Filled 2020-01-22 (×3): qty 1

## 2020-01-22 MED ORDER — METOPROLOL TARTRATE 12.5 MG HALF TABLET
12.5000 mg | ORAL_TABLET | Freq: Once | ORAL | Status: AC
  Administered 2020-01-22: 12.5 mg via ORAL
  Filled 2020-01-22: qty 1

## 2020-01-22 MED ORDER — NITROGLYCERIN 0.4 MG SL SUBL
0.4000 mg | SUBLINGUAL_TABLET | SUBLINGUAL | Status: DC | PRN
  Administered 2020-01-22 (×4): 0.4 mg via SUBLINGUAL
  Filled 2020-01-22 (×2): qty 1

## 2020-01-22 NOTE — Progress Notes (Signed)
CARDIAC REHAB PHASE I   MI ed completed with pt. Pt educated on importance of ASA and Plavix. Pt states he has been out of Plavix for one week, confirmed by wife. Pt given MI book along with heart healthy and diabetic diets. Reviewed site care and restrictions. Held ambulation as pt recently received lunch and is having some residual CP, 2/10 after tylenol. Pt waiting on echo. Will refer to CRP II  At [facility], pt states he is not interested in attending as he does not have time. Will f/u Monday if pt remains in hospital.  [id number] Reynold Bowen, RN BSN 01/22/2020 12:00 PM

## 2020-01-22 NOTE — Progress Notes (Signed)
Progress Note  Patient Name: Bryan Rowland Date of Encounter: 01/22/2020  Primary Cardiologist:  [facility]  Subjective   Sitting in bedside chair.  States that he had some recurrent chest discomfort this morning with diaphoresis, resolved.  No palpitations or breathlessness.  Inpatient Medications    Scheduled Meds: . aspirin  81 mg Oral Daily  . atorvastatin  80 mg Oral Daily  . Chlorhexidine Gluconate Cloth  6 each Topical Daily  . clopidogrel  75 mg Oral Q breakfast  . enoxaparin (LOVENOX) injection  40 mg Subcutaneous Q24H  . insulin aspart  0-15 Units Subcutaneous TID WC  . insulin aspart  0-5 Units Subcutaneous QHS  . mouth rinse  15 mL Mouth Rinse BID  . metoprolol succinate  12.5 mg Oral QHS  . sodium chloride flush  3 mL Intravenous Q12H   Continuous Infusions: . sodium chloride 10 mL/hr at 01/22/20 0700   PRN Meds: sodium chloride, acetaminophen, alum & mag hydroxide-simeth, nitroGLYCERIN, ondansetron (ZOFRAN) IV, sodium chloride flush   Vital Signs    Vitals:   01/22/20 0601 01/22/20 0700 01/22/20 0715 01/22/20 0716  BP:    114/80  Pulse: 85 86  75  Resp: 18 (!) 22  17  Temp:   98.4 F (36.9 C)   TempSrc:      SpO2: 93% 94%  94%    Intake/Output Summary (Last 24 hours) at 01/22/2020 0856 Last data filed at 01/22/2020 0717 Gross per 24 hour  Intake 1590.65 ml  Output 2325 ml  Net -734.35 ml   There were no vitals filed for this visit.  Telemetry    Sinus rhythm.  Personally reviewed.  ECG    An ECG dated 01/22/2020 was personally reviewed today and demonstrated:  Sinus rhythm with evolutionary changes of recent inferolateral STEMI, persistent ST elevation yet decreased compared to last tracing.  Physical Exam   GEN: No acute distress.   Neck: No JVD. Cardiac: RRR, soft systolic murmur, no gallop.  Respiratory: Nonlabored. Clear to auscultation bilaterally. GI: Soft, nontender, bowel sounds present. MS: No edema; No deformity. Neuro:   Nonfocal. Psych: Alert and oriented x 3. Normal affect.  Labs    Chemistry Recent Labs  Lab 01/21/20 1805 01/21/20 1805 01/21/20 1806 01/21/20 1827 01/22/20 0613  NA 139   < > 137 133* 134*  K 3.7   < > 4.0 3.2* 4.4  CL 108  --  105  --  102  CO2  --   --  13*  --  19*  GLUCOSE 216*  --  215*  --  188*  BUN 28*  --  29*  --  26*  CREATININE 1.00  --  1.18  --  1.20  CALCIUM  --   --  9.0  --  8.9  PROT  --   --  7.3  --   --   ALBUMIN  --   --  4.4  --   --   AST  --   --  30  --   --   ALT  --   --  27  --   --   ALKPHOS  --   --  83  --   --   BILITOT  --   --  1.0  --   --   GFRNONAA  --   --  >60  --  >60  GFRAA  --   --  >60  --  >60  ANIONGAP  --   --  19*  --  13   < > = values in this interval not displayed.     Hematology Recent Labs  Lab 01/21/20 1806 01/21/20 1827 01/22/20 0613  WBC 16.3*  --  16.6*  RBC 6.22*  --  6.05*  HGB 17.6* 17.3* 16.9  HCT 52.9* 51.0 52.3*  MCV 85.0  --  86.4  MCH 28.3  --  27.9  MCHC 33.3  --  32.3  RDW 12.1  --  12.6  PLT 357  --  331    Cardiac Enzymes Recent Labs  Lab 01/21/20 1806 01/21/20 2159 01/22/20 0613  TROPONINIHS 11 >27,000* >27,000*    Radiology    CARDIAC CATHETERIZATION  Result Date: 01/21/2020 Conclusions: 1. Inferolateral STEMI with late stent thrombosis and 100% thrombotic occlusion of the mid LCx.  There is also 99% stenosis involving the jailed mid/distal LCx.  LCx/OM2 stent is significant underexpanded with area of apparent in-stent restenosis just before the LCx/OM2 bifurcation. 2. Mild to moderate proximal/mid LAD disease (not hemodynamically significant by iFR at [facility] in 05/2019) and mild in-stent restenosis of the proximal RCA. 3. Moderately to severely reduced left ventricular systolic function with inferior hypokinesis/akinesis. 4. Normal left ventricular filling pressure. 5. Successful IVUS-guided bifurcation PCI to LCx/OM2 using Synergy 3.5 x 20 mm drug-eluting stent and kissing balloon  angioplasty of the LCx and OM2 with acceptable angiographic result and TIMI-3 flow. Recommendations: 1. Continue indefinite dual antiplatelet therapy with aspirin and clopidogrel (given history of subarachnoid hemorrhage).  I will send a CYP2C19 genotype to ensure that the patient has adequate response to clopidogrel, given stent thrombosis. 2. Aggressive secondary prevention. Nelva Bush, MD [facility]    Patient Profile     59 y.o. male with a history of CAD status post previous DES intervention to the RCA and circumflex, hypertension, hyperlipidemia, and type 2 diabetes mellitus.  Acute inferolateral STEMI due to late stent thrombosis within the mid circumflex.  Assessment & Plan    1.  Presentation with acute inferolateral STEMI due to late stent thrombosis involving the mid circumflex.  Post IVIS guided bifurcational PCI of the left circumflex/OM 2 using drug-eluting stent.  Currently on aspirin and Plavix (history of subarachnoid hemorrhage).  Moderate to severe reduction in LVEF at angiography, echocardiogram pending.  Residual CAD including nonobstructive mild to moderate proximal to mid LAD and mild in-stent restenosis of the proximal RCA.  2.  Essential hypertension.  3.  Mixed hyperlipidemia, on high-dose statin for now, on PCSK9 inhibitor as an outpatient.  4.  Type 2 diabetes mellitus, on insulin.  Continue aspirin, Plavix, high-dose Lipitor, and Toprol-XL.  Also starting low-dose losartan.  Follow-up echocardiogram pending.  Continue to cycle high-sensitivity troponin I levels, check serial ECG. CYP2C19 genotype pending.  Signed, Rozann Lesches, MD  01/22/2020, 8:56 AM

## 2020-01-22 NOTE — Progress Notes (Signed)
  Echocardiogram 2D Echocardiogram has been performed.  Roosvelt Maser F 01/22/2020, 4:05 PM

## 2020-01-23 ENCOUNTER — Other Ambulatory Visit: Payer: Self-pay

## 2020-01-23 ENCOUNTER — Encounter (HOSPITAL_COMMUNITY): Payer: Self-pay | Admitting: Internal Medicine

## 2020-01-23 LAB — GLUCOSE, CAPILLARY
Glucose-Capillary: 151 mg/dL — ABNORMAL HIGH (ref 70–99)
Glucose-Capillary: 197 mg/dL — ABNORMAL HIGH (ref 70–99)
Glucose-Capillary: 180 mg/dL — ABNORMAL HIGH (ref 70–99)
Glucose-Capillary: 158 mg/dL — ABNORMAL HIGH (ref 70–99)

## 2020-01-23 LAB — BASIC METABOLIC PANEL
Anion gap: 14 (ref 5–15)
BUN: 23 mg/dL — ABNORMAL HIGH (ref 6–20)
CO2: 25 mmol/L (ref 22–32)
Calcium: 9.3 mg/dL (ref 8.9–10.3)
Chloride: 100 mmol/L (ref 98–111)
Creatinine, Ser: 1.15 mg/dL (ref 0.61–1.24)
GFR calc Af Amer: >60 mL/min (ref >60)
GFR calc non Af Amer: >60 mL/min (ref >60)
Glucose, Bld: 154 mg/dL — ABNORMAL HIGH (ref 70–99)
Potassium: 4.4 mmol/L (ref 3.5–5.1)
Sodium: 139 mmol/L (ref 135–145)

## 2020-01-23 LAB — CBC
HCT: 52.2 % — ABNORMAL HIGH (ref 39.0–52.0)
Hemoglobin: 16.9 g/dL (ref 13.0–17.0)
MCH: 28.1 pg (ref 26.0–34.0)
MCHC: 32.4 g/dL (ref 30.0–36.0)
MCV: 86.7 fL (ref 80.0–100.0)
Platelets: 278 10*3/uL (ref 150–400)
RBC: 6.02 MIL/uL — ABNORMAL HIGH (ref 4.22–5.81)
RDW: 12.6 % (ref 11.5–15.5)
WBC: 12.9 10*3/uL — ABNORMAL HIGH (ref 4.0–10.5)
nRBC: 0 % (ref 0.0–0.2)

## 2020-01-23 LAB — TROPONIN I (HIGH SENSITIVITY): Troponin I (High Sensitivity): >27000 ng/L (ref <18)

## 2020-01-23 MED ORDER — METOPROLOL SUCCINATE ER 25 MG PO TB24
25.0000 mg | ORAL_TABLET | Freq: Every day | ORAL | Status: DC
  Administered 2020-01-23: 25 mg via ORAL
  Filled 2020-01-23 (×2): qty 1

## 2020-01-23 NOTE — Progress Notes (Signed)
Progress Note  Patient Name: Bryan Rowland Date of Encounter: 01/23/2020  Primary Cardiologist:  [facility] cardiology  Subjective   Chest pain has resolved, no shortness of breath or palpitations.  Sitting in chair this morning.  Inpatient Medications    Scheduled Meds: . aspirin  81 mg Oral Daily  . atorvastatin  80 mg Oral Daily  . Chlorhexidine Gluconate Cloth  6 each Topical Daily  . clopidogrel  75 mg Oral Q breakfast  . enoxaparin (LOVENOX) injection  40 mg Subcutaneous Q24H  . insulin aspart  0-15 Units Subcutaneous TID WC  . insulin aspart  0-5 Units Subcutaneous QHS  . losartan  12.5 mg Oral Daily  . mouth rinse  15 mL Mouth Rinse BID  . metoprolol succinate  12.5 mg Oral QHS  . sodium chloride flush  3 mL Intravenous Q12H   Continuous Infusions: . sodium chloride Stopped (01/22/20 0717)   PRN Meds: sodium chloride, acetaminophen, alum & mag hydroxide-simeth, nitroGLYCERIN, ondansetron (ZOFRAN) IV, sodium chloride flush   Vital Signs    Vitals:   01/23/20 0500 01/23/20 0600 01/23/20 0700 01/23/20 0729  BP: 119/87 106/87 125/85   Pulse: 93 94 (!) 120   Resp: (!) 22 (!) 24 16   Temp:    98.3 F (36.8 C)  TempSrc:    Oral  SpO2: 93% 93% 94%     Intake/Output Summary (Last 24 hours) at 01/23/2020 0806 Last data filed at 01/23/2020 0700 Gross per 24 hour  Intake 725.98 ml  Output 300 ml  Net 425.98 ml   There were no vitals filed for this visit.  Telemetry    Sinus rhythm.  Personally reviewed.  ECG    I personally reviewed the tracing from 01/23/2020 which shows sinus rhythm with persistent inferolateral ST elevation consistent with recent ACS.  Physical Exam   GEN: No acute distress.   Neck: No JVD. Cardiac: RRR, soft systolic murmur without gallop.  Respiratory: Nonlabored.  Clear to auscultation bilaterally. GI: Soft, nontender, bowel sounds present. MS:  No edema; No deformity. Neuro:  Nonfocal. Psych: Alert and oriented x 3. Normal  affect.  Labs    Chemistry Recent Labs  Lab 01/21/20 1806 01/21/20 1806 01/21/20 1827 01/22/20 0613 01/23/20 0012  NA 137   < > 133* 134* 139  K 4.0   < > 3.2* 4.4 4.4  CL 105  --   --  102 100  CO2 13*  --   --  19* 25  GLUCOSE 215*  --   --  188* 154*  BUN 29*  --   --  26* 23*  CREATININE 1.18  --   --  1.20 1.15  CALCIUM 9.0  --   --  8.9 9.3  PROT 7.3  --   --   --   --   ALBUMIN 4.4  --   --   --   --   AST 30  --   --   --   --   ALT 27  --   --   --   --   ALKPHOS 83  --   --   --   --   BILITOT 1.0  --   --   --   --   GFRNONAA >60  --   --  >60 >60  GFRAA >60  --   --  >60 >60  ANIONGAP 19*  --   --  13 14   < > =  values in this interval not displayed.     Hematology Recent Labs  Lab 01/21/20 1806 01/21/20 1806 01/21/20 1827 01/22/20 0613 01/23/20 0012  WBC 16.3*  --   --  16.6* 12.9*  RBC 6.22*  --   --  6.05* 6.02*  HGB 17.6*   < > 17.3* 16.9 16.9  HCT 52.9*   < > 51.0 52.3* 52.2*  MCV 85.0  --   --  86.4 86.7  MCH 28.3  --   --  27.9 28.1  MCHC 33.3  --   --  32.3 32.4  RDW 12.1  --   --  12.6 12.6  PLT 357  --   --  331 278   < > = values in this interval not displayed.    Cardiac Enzymes Recent Labs  Lab 01/21/20 1806 01/21/20 2159 01/22/20 0613 01/22/20 1420 01/23/20 0012  TROPONINIHS 11 >27,000* >27,000* >27,000* >27,000*    Radiology    CARDIAC CATHETERIZATION  Result Date: 01/21/2020 Conclusions: 1. Inferolateral STEMI with late stent thrombosis and 100% thrombotic occlusion of the mid LCx.  There is also 99% stenosis involving the jailed mid/distal LCx.  LCx/OM2 stent is significant underexpanded with area of apparent in-stent restenosis just before the LCx/OM2 bifurcation. 2. Mild to moderate proximal/mid LAD disease (not hemodynamically significant by iFR at [facility] in 05/2019) and mild in-stent restenosis of the proximal RCA. 3. Moderately to severely reduced left ventricular systolic function with inferior hypokinesis/akinesis. 4.  Normal left ventricular filling pressure. 5. Successful IVUS-guided bifurcation PCI to LCx/OM2 using Synergy 3.5 x 20 mm drug-eluting stent and kissing balloon angioplasty of the LCx and OM2 with acceptable angiographic result and TIMI-3 flow. Recommendations: 1. Continue indefinite dual antiplatelet therapy with aspirin and clopidogrel (given history of subarachnoid hemorrhage).  I will send a CYP2C19 genotype to ensure that the patient has adequate response to clopidogrel, given stent thrombosis. 2. Aggressive secondary prevention. Nelva Bush, MD [facility]   ECHOCARDIOGRAM COMPLETE  Result Date: 01/22/2020    ECHOCARDIOGRAM REPORT   Patient Name:   Bryan Rowland Date of Exam: 01/22/2020 Medical Rec #:  [id number]   Height:       66.0 in Accession #:    [id number]  Weight:       185.0 lb Date of Birth:  1960/09/28   BSA:          1.935 m Patient Age:    59 years    BP:           132/84 mmHg Patient Gender: M           HR:           91 bpm. Exam Location:  Inpatient Procedure: 2D Echo, Cardiac Doppler and Color Doppler Indications:    Acute MI  History:        Patient has no prior history of Echocardiogram examinations.                 Signs/Symptoms:Chest Pain.  Sonographer:    Merrie Roof RDCS Referring Phys: [place]  1. Posterior lateral inferior bassal and septal hypokinesis. Left ventricular ejection fraction, by estimation, is 35 to 40%. The left ventricle has moderately decreased function. The left ventricle demonstrates regional wall motion abnormalities (see scoring diagram/findings for description). The left ventricular internal cavity size was mildly dilated. Left ventricular diastolic parameters were normal.  2. Right ventricular systolic function is normal. The right ventricular size is normal.  3. Left atrial  size was mildly dilated.  4. The mitral valve is normal in structure. Mild mitral valve regurgitation. No evidence of mitral stenosis.  5. The aortic valve is  tricuspid. Aortic valve regurgitation is not visualized. Mild to moderate aortic valve sclerosis/calcification is present, without any evidence of aortic stenosis.  6. The inferior vena cava is normal in size with greater than 50% respiratory variability, suggesting right atrial pressure of 3 mmHg. FINDINGS  Left Ventricle: Posterior lateral inferior bassal and septal hypokinesis. Left ventricular ejection fraction, by estimation, is 35 to 40%. The left ventricle has moderately decreased function. The left ventricle demonstrates regional wall motion abnormalities. The left ventricular internal cavity size was mildly dilated. There is no left ventricular hypertrophy. Left ventricular diastolic parameters were normal. Right Ventricle: The right ventricular size is normal. No increase in right ventricular wall thickness. Right ventricular systolic function is normal. Left Atrium: Left atrial size was mildly dilated. Right Atrium: Right atrial size was normal in size. Pericardium: There is no evidence of pericardial effusion. Mitral Valve: The mitral valve is normal in structure. There is mild thickening of the mitral valve leaflet(s). Normal mobility of the mitral valve leaflets. Mild mitral annular calcification. Mild mitral valve regurgitation. No evidence of mitral valve stenosis. Tricuspid Valve: The tricuspid valve is normal in structure. Tricuspid valve regurgitation is not demonstrated. No evidence of tricuspid stenosis. Aortic Valve: The aortic valve is tricuspid. Aortic valve regurgitation is not visualized. Mild to moderate aortic valve sclerosis/calcification is present, without any evidence of aortic stenosis. Aortic valve mean gradient measures 4.0 mmHg. Aortic valve peak gradient measures 7.2 mmHg. Pulmonic Valve: The pulmonic valve was normal in structure. Pulmonic valve regurgitation is not visualized. No evidence of pulmonic stenosis. Aorta: The aortic root is normal in size and structure. Venous: The  inferior vena cava is normal in size with greater than 50% respiratory variability, suggesting right atrial pressure of 3 mmHg. IAS/Shunts: No atrial level shunt detected by color flow Doppler.  LEFT VENTRICLE PLAX 2D LVIDd:         5.00 cm     Diastology LVIDs:         3.60 cm     LV e' lateral:   7.72 cm/s LV PW:         1.00 cm     LV E/e' lateral: 9.5 LV IVS:        1.00 cm     LV e' medial:    6.85 cm/s                            LV E/e' medial:  10.8  LV Volumes (MOD) LV vol d, MOD A2C: 98.3 ml LV vol d, MOD A4C: 81.2 ml LV vol s, MOD A2C: 38.3 ml 3D Volume EF: LV vol s, MOD A4C: 46.2 ml 3D EF:        45 % LV SV MOD A2C:     60.0 ml LV EDV:       95 ml LV SV MOD A4C:     81.2 ml LV ESV:       53 ml LV SV MOD BP:      44.4 ml LV SV:        43 ml RIGHT VENTRICLE             IVC RV Basal diam:  3.20 cm     IVC diam: 1.60 cm RV S prime:  16.40 cm/s TAPSE (M-mode): 1.6 cm LEFT ATRIUM             Index       RIGHT ATRIUM           Index LA diam:        4.20 cm 2.17 cm/m  RA Area:     13.00 cm LA Vol (A2C):   54.7 ml 28.27 ml/m RA Volume:   30.50 ml  15.77 ml/m LA Vol (A4C):   38.0 ml 19.64 ml/m LA Biplane Vol: 48.8 ml 25.22 ml/m  AORTIC VALVE AV Vmax:           134.00 cm/s AV Vmean:          92.500 cm/s AV VTI:            0.205 m AV Peak Grad:      7.2 mmHg AV Mean Grad:      4.0 mmHg LVOT Vmax:         102.00 cm/s LVOT Vmean:        62.600 cm/s LVOT VTI:          0.146 m LVOT/AV VTI ratio: 0.71  AORTA Ao Root diam: 3.20 cm Ao Asc diam:  3.40 cm MITRAL VALVE MV Area (PHT): 4.86 cm    SHUNTS MV Decel Time: 156 msec    Systemic VTI: 0.15 m MV E velocity: 73.70 cm/s MV A velocity: 61.30 cm/s MV E/A ratio:  1.20 Charlton Haws MD Electronically signed by Charlton Haws MD Signature Date/Time: 01/22/2020/4:30:28 PM    Final     Patient Profile     59 y.o. male with a history of CAD status post previous DES intervention to the RCA and circumflex, hypertension, hyperlipidemia, and type 2 diabetes mellitus.  Acute  inferolateral STEMI due to late stent thrombosis within the mid circumflex.  Assessment & Plan    1.  Presentation with acute inferolateral STEMI due to late stent thrombosis involving the mid circumflex.  Post IVIS guided bifurcational PCI of the left circumflex/OM 2 using drug-eluting stent.  Currently on aspirin and Plavix (history of subarachnoid hemorrhage).  Echocardiogram shows LVEF 35 to 40% with posterior lateral hypokinesis and mild mitral regurgitation. Residual CAD including nonobstructive mild to moderate proximal to mid LAD and mild in-stent restenosis of the proximal RCA.  Chest pain has resolved despite persistent ST segment abnormalities.  2.  Essential hypertension.  Blood pressure has been stable.  3.  Mixed hyperlipidemia, on high-dose statin for now, on PCSK9 inhibitor as an outpatient.  4.  Type 2 diabetes mellitus, on insulin.  Transfer out to telemetry.  Continue aspirin, Plavix, high-dose Lipitor.  Move Toprol-XL to morning dose at 25 mg daily.  Also continue low-dose losartan.  Start Aldactone in the morning.  CYP2C19 genotype pending.  Signed, Nona Dell, MD  01/23/2020, 8:06 AM

## 2020-01-23 NOTE — Progress Notes (Signed)
EKG CRITICAL VALUE     12 lead EKG performed.  Critical value noted.  vivian, RN notified.   Rachel Bo, CCT 01/23/2020 8:29 AM

## 2020-01-24 DIAGNOSIS — E118 Type 2 diabetes mellitus with unspecified complications: Secondary | ICD-10-CM

## 2020-01-24 DIAGNOSIS — I2510 Atherosclerotic heart disease of native coronary artery without angina pectoris: Secondary | ICD-10-CM

## 2020-01-24 DIAGNOSIS — I25118 Atherosclerotic heart disease of native coronary artery with other forms of angina pectoris: Secondary | ICD-10-CM

## 2020-01-24 DIAGNOSIS — E782 Mixed hyperlipidemia: Secondary | ICD-10-CM

## 2020-01-24 DIAGNOSIS — E785 Hyperlipidemia, unspecified: Secondary | ICD-10-CM

## 2020-01-24 DIAGNOSIS — I10 Essential (primary) hypertension: Secondary | ICD-10-CM

## 2020-01-24 LAB — CBC
HCT: 48.1 % (ref 39.0–52.0)
Hemoglobin: 15.7 g/dL (ref 13.0–17.0)
MCH: 28.5 pg (ref 26.0–34.0)
MCHC: 32.6 g/dL (ref 30.0–36.0)
MCV: 87.3 fL (ref 80.0–100.0)
Platelets: 243 10*3/uL (ref 150–400)
RBC: 5.51 MIL/uL (ref 4.22–5.81)
RDW: 12.3 % (ref 11.5–15.5)
WBC: 9.6 10*3/uL (ref 4.0–10.5)
nRBC: 0 % (ref 0.0–0.2)

## 2020-01-24 LAB — GLUCOSE, CAPILLARY: Glucose-Capillary: 162 mg/dL — ABNORMAL HIGH (ref 70–99)

## 2020-01-24 LAB — BASIC METABOLIC PANEL
Anion gap: 8 (ref 5–15)
BUN: 27 mg/dL — ABNORMAL HIGH (ref 6–20)
CO2: 23 mmol/L (ref 22–32)
Calcium: 8.7 mg/dL — ABNORMAL LOW (ref 8.9–10.3)
Chloride: 104 mmol/L (ref 98–111)
Creatinine, Ser: 1.09 mg/dL (ref 0.61–1.24)
GFR calc Af Amer: >60 mL/min (ref >60)
GFR calc non Af Amer: >60 mL/min (ref >60)
Glucose, Bld: 173 mg/dL — ABNORMAL HIGH (ref 70–99)
Potassium: 4 mmol/L (ref 3.5–5.1)
Sodium: 135 mmol/L (ref 135–145)

## 2020-01-24 LAB — PLATELET INHIBITION P2Y12: Platelet Function  P2Y12: 66 [PRU] — ABNORMAL LOW (ref 182–335)

## 2020-01-24 MED ORDER — CLOPIDOGREL BISULFATE 75 MG PO TABS: 75.0000 mg | ORAL_TABLET | Freq: Every day | ORAL | 11 refills | Status: AC

## 2020-01-24 MED ORDER — METOPROLOL SUCCINATE ER 25 MG PO TB24: 37.5000 mg | ORAL_TABLET | Freq: Every day | ORAL | 2 refills | Status: AC

## 2020-01-24 MED ORDER — LOSARTAN POTASSIUM 25 MG PO TABS: 12.5000 mg | ORAL_TABLET | Freq: Every day | ORAL | 2 refills | Status: AC

## 2020-01-24 MED ORDER — ATORVASTATIN CALCIUM 80 MG PO TABS: 80.0000 mg | ORAL_TABLET | Freq: Every day | ORAL | 2 refills | Status: AC

## 2020-01-24 MED ORDER — NITROGLYCERIN 0.4 MG SL SUBL: 0.4000 mg | SUBLINGUAL_TABLET | SUBLINGUAL | 2 refills | Status: AC | PRN

## 2020-01-24 MED ORDER — METOPROLOL SUCCINATE ER 25 MG PO TB24
37.5000 mg | ORAL_TABLET | Freq: Every day | ORAL | Status: DC
  Administered 2020-01-24: 37.5 mg via ORAL
  Filled 2020-01-24: qty 2

## 2020-01-24 MED FILL — ATORVASTATIN CALCIUM 80 MG: 80 | 30 days supply | Qty: 30 | Fill #0

## 2020-01-24 MED FILL — LOSARTAN POTASSIUM 25 MG TA: 25 | 30 days supply | Qty: 15 | Fill #0

## 2020-01-24 MED FILL — CLOPIDOGREL 75 MG TABLET: 75 | 30 days supply | Qty: 30 | Fill #0

## 2020-01-24 MED FILL — METOPROLOL SUCCINATE ER 25: 25 | 45 days supply | Qty: 45 | Fill #0

## 2020-01-24 MED FILL — NITROGLYCERIN 0.4 MG TAB SL: 0.4 | 7 days supply | Qty: 25 | Fill #0

## 2020-01-24 MED FILL — Lidocaine HCl Local Preservative Free (PF) Inj 1%: INTRAMUSCULAR | Qty: 30 | Status: AC
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
## 2020-01-24 NOTE — Progress Notes (Signed)
CARDIAC REHAB PHASE I   Pt ambulating without difficulty. Pt states he is no longer having CP or heaviness. Pt denies questions or concerns. Stressed importance of not running out of Plavix, and taking meds everyday. Pt referred to [facility] with awareness pt not interested in attending.   [phone] Reynold Bowen, RN BSN 01/24/2020 10:11 AM

## 2020-01-24 NOTE — Care Management Important Message (Signed)
Important Message  Patient Details  Name: Bryan Rowland MRN: [id number] Date of Birth: Jun 28, 1961   Medicare Important Message Given:  Yes     Renie Ora 01/24/2020, 10:46 AM

## 2020-01-24 NOTE — Discharge Instructions (Signed)
Medication Changes: - CONTINUE Aspirin and Plavix. These medications are very important in keeping the new stent in your heart open. - INCREASE Metoprolol succinate (Toprol-XL) to 37.5mg  daily. - STOP Lisinopril and START Losartan 12.5mg  daily. - STOP Rosuvastatin (Crestor) and START Atorvastatin (Lipitor) 80mg  daily.  We stopped your Oxycodone since you said you don't really take this anymore at home.  Please continue all other medications as directed elsewhere on discharge paperwork.   Post STEMI: NO HEAVY LIFTING X 4 WEEKS. NO SEXUAL ACTIVITY X 4 WEEKS. NO DRIVING X 2 WEEKS. NO SOAKING BATHS, HOT TUBS, POOLS, ETC., X 7 DAYS.  Radial Site Care: Refer to this sheet in the next few weeks. These instructions provide you with information on caring for yourself after your procedure. Your caregiver may also give you more specific instructions. Your treatment has been planned according to current medical practices, but problems sometimes occur. Call your caregiver if you have any problems or questions after your procedure. HOME CARE INSTRUCTIONS  You may shower the day after the procedure.Remove the bandage (dressing) and gently wash the site with plain soap and water.Gently pat the site dry.   Do not apply powder or lotion to the site.   Do not submerge the affected site in water for 3 to 5 days.   Inspect the site at least twice daily.   Do not flex or bend the affected arm for 24 hours.   No lifting over 5 pounds (2.3 kg) for 5 days after your procedure.   Do not drive home if you are discharged the same day of the procedure. Have someone else drive you.   What to expect:  Any bruising will usually fade within 1 to 2 weeks.   Blood that collects in the tissue (hematoma) may be painful to the touch. It should usually decrease in size and tenderness within 1 to 2 weeks.  SEEK IMMEDIATE MEDICAL CARE IF:  You have unusual pain at the radial site.   You have redness, warmth,  swelling, or pain at the radial site.   You have drainage (other than a small amount of blood on the dressing).   You have chills.   You have a fever or persistent symptoms for more than 72 hours.   You have a fever and your symptoms suddenly get worse.   Your arm becomes pale, cool, tingly, or numb.   You have heavy bleeding from the site. Hold pressure on the site.

## 2020-01-25 ENCOUNTER — Telehealth (HOSPITAL_COMMUNITY): Payer: Self-pay

## 2020-01-25 NOTE — Telephone Encounter (Signed)
Faxed referral to [facility] for Cardiac Rehab.

## 2020-02-03 LAB — CYTOCHROME P450 2C19

## 2021-10-03 ENCOUNTER — Inpatient Hospital Stay (HOSPITAL_COMMUNITY)
Admission: EM | Admit: 2021-10-03 | Discharge: 2021-10-10 | DRG: 064 | Disposition: E | Payer: Medicare HMO | Attending: Neurology | Admitting: Neurology

## 2021-10-03 ENCOUNTER — Inpatient Hospital Stay (HOSPITAL_COMMUNITY): Payer: Medicare HMO

## 2021-10-03 ENCOUNTER — Emergency Department (HOSPITAL_COMMUNITY): Payer: Medicare HMO

## 2021-10-03 ENCOUNTER — Encounter (HOSPITAL_COMMUNITY): Payer: Self-pay | Admitting: Radiology

## 2021-10-03 DIAGNOSIS — Z8249 Family history of ischemic heart disease and other diseases of the circulatory system: Secondary | ICD-10-CM | POA: Diagnosis not present

## 2021-10-03 DIAGNOSIS — Z955 Presence of coronary angioplasty implant and graft: Secondary | ICD-10-CM | POA: Diagnosis not present

## 2021-10-03 DIAGNOSIS — G935 Compression of brain: Secondary | ICD-10-CM | POA: Diagnosis present

## 2021-10-03 DIAGNOSIS — Z978 Presence of other specified devices: Secondary | ICD-10-CM

## 2021-10-03 DIAGNOSIS — G8194 Hemiplegia, unspecified affecting left nondominant side: Secondary | ICD-10-CM | POA: Diagnosis present

## 2021-10-03 DIAGNOSIS — E7800 Pure hypercholesterolemia, unspecified: Secondary | ICD-10-CM | POA: Diagnosis present

## 2021-10-03 DIAGNOSIS — R414 Neurologic neglect syndrome: Secondary | ICD-10-CM | POA: Diagnosis present

## 2021-10-03 DIAGNOSIS — E1165 Type 2 diabetes mellitus with hyperglycemia: Secondary | ICD-10-CM | POA: Diagnosis present

## 2021-10-03 DIAGNOSIS — Z7982 Long term (current) use of aspirin: Secondary | ICD-10-CM

## 2021-10-03 DIAGNOSIS — R29810 Facial weakness: Secondary | ICD-10-CM | POA: Diagnosis present

## 2021-10-03 DIAGNOSIS — Z8673 Personal history of transient ischemic attack (TIA), and cerebral infarction without residual deficits: Secondary | ICD-10-CM | POA: Diagnosis not present

## 2021-10-03 DIAGNOSIS — I255 Ischemic cardiomyopathy: Secondary | ICD-10-CM | POA: Diagnosis present

## 2021-10-03 DIAGNOSIS — Z7902 Long term (current) use of antithrombotics/antiplatelets: Secondary | ICD-10-CM

## 2021-10-03 DIAGNOSIS — G936 Cerebral edema: Secondary | ICD-10-CM | POA: Diagnosis present

## 2021-10-03 DIAGNOSIS — R471 Dysarthria and anarthria: Secondary | ICD-10-CM | POA: Diagnosis present

## 2021-10-03 DIAGNOSIS — Z515 Encounter for palliative care: Secondary | ICD-10-CM

## 2021-10-03 DIAGNOSIS — R1310 Dysphagia, unspecified: Secondary | ICD-10-CM | POA: Diagnosis present

## 2021-10-03 DIAGNOSIS — Z66 Do not resuscitate: Secondary | ICD-10-CM | POA: Diagnosis not present

## 2021-10-03 DIAGNOSIS — I2510 Atherosclerotic heart disease of native coronary artery without angina pectoris: Secondary | ICD-10-CM | POA: Diagnosis present

## 2021-10-03 DIAGNOSIS — I609 Nontraumatic subarachnoid hemorrhage, unspecified: Secondary | ICD-10-CM | POA: Diagnosis present

## 2021-10-03 DIAGNOSIS — Z20822 Contact with and (suspected) exposure to covid-19: Secondary | ICD-10-CM | POA: Diagnosis present

## 2021-10-03 DIAGNOSIS — I10 Essential (primary) hypertension: Secondary | ICD-10-CM | POA: Diagnosis present

## 2021-10-03 DIAGNOSIS — I161 Hypertensive emergency: Secondary | ICD-10-CM | POA: Diagnosis present

## 2021-10-03 DIAGNOSIS — Z882 Allergy status to sulfonamides status: Secondary | ICD-10-CM

## 2021-10-03 DIAGNOSIS — H518 Other specified disorders of binocular movement: Secondary | ICD-10-CM | POA: Diagnosis present

## 2021-10-03 DIAGNOSIS — I619 Nontraumatic intracerebral hemorrhage, unspecified: Secondary | ICD-10-CM | POA: Diagnosis present

## 2021-10-03 DIAGNOSIS — J9601 Acute respiratory failure with hypoxia: Secondary | ICD-10-CM | POA: Diagnosis present

## 2021-10-03 DIAGNOSIS — Z79899 Other long term (current) drug therapy: Secondary | ICD-10-CM

## 2021-10-03 DIAGNOSIS — Z87891 Personal history of nicotine dependence: Secondary | ICD-10-CM

## 2021-10-03 DIAGNOSIS — I611 Nontraumatic intracerebral hemorrhage in hemisphere, cortical: Secondary | ICD-10-CM | POA: Diagnosis present

## 2021-10-03 DIAGNOSIS — Z880 Allergy status to penicillin: Secondary | ICD-10-CM

## 2021-10-03 DIAGNOSIS — Z881 Allergy status to other antibiotic agents status: Secondary | ICD-10-CM

## 2021-10-03 DIAGNOSIS — Q283 Other malformations of cerebral vessels: Secondary | ICD-10-CM

## 2021-10-03 DIAGNOSIS — Z7984 Long term (current) use of oral hypoglycemic drugs: Secondary | ICD-10-CM

## 2021-10-03 DIAGNOSIS — G9389 Other specified disorders of brain: Secondary | ICD-10-CM | POA: Diagnosis present

## 2021-10-03 DIAGNOSIS — Z91040 Latex allergy status: Secondary | ICD-10-CM

## 2021-10-03 DIAGNOSIS — Z885 Allergy status to narcotic agent status: Secondary | ICD-10-CM

## 2021-10-03 LAB — I-STAT CHEM 8, ED
BUN: 23 mg/dL (ref 8–23)
Calcium, Ion: 1.13 mmol/L — ABNORMAL LOW (ref 1.15–1.40)
Chloride: 105 mmol/L (ref 98–111)
Creatinine, Ser: 0.9 mg/dL (ref 0.61–1.24)
Glucose, Bld: 171 mg/dL — ABNORMAL HIGH (ref 70–99)
HCT: 49 % (ref 39.0–52.0)
Hemoglobin: 16.7 g/dL (ref 13.0–17.0)
Potassium: 4 mmol/L (ref 3.5–5.1)
Sodium: 140 mmol/L (ref 135–145)
TCO2: 25 mmol/L (ref 22–32)

## 2021-10-03 LAB — COMPREHENSIVE METABOLIC PANEL
ALT: 21 U/L (ref 0–44)
AST: 29 U/L (ref 15–41)
Albumin: 4.1 g/dL (ref 3.5–5.0)
Alkaline Phosphatase: 76 U/L (ref 38–126)
Anion gap: 11 (ref 5–15)
BUN: 18 mg/dL (ref 8–23)
CO2: 21 mmol/L — ABNORMAL LOW (ref 22–32)
Calcium: 9.2 mg/dL (ref 8.9–10.3)
Chloride: 105 mmol/L (ref 98–111)
Creatinine, Ser: 0.98 mg/dL (ref 0.61–1.24)
GFR, Estimated: >60 mL/min (ref >60)
Glucose, Bld: 174 mg/dL — ABNORMAL HIGH (ref 70–99)
Potassium: 4.2 mmol/L (ref 3.5–5.1)
Sodium: 137 mmol/L (ref 135–145)
Total Bilirubin: 1 mg/dL (ref 0.3–1.2)
Total Protein: 6.7 g/dL (ref 6.5–8.1)

## 2021-10-03 LAB — DIFFERENTIAL
Abs Immature Granulocytes: 0.01 10*3/uL (ref 0.00–0.07)
Basophils Absolute: 0.1 10*3/uL (ref 0.0–0.1)
Basophils Relative: 1 %
Eosinophils Absolute: 0.2 10*3/uL (ref 0.0–0.5)
Eosinophils Relative: 3 %
Immature Granulocytes: 0 %
Lymphocytes Relative: 28 %
Lymphs Abs: 2 10*3/uL (ref 0.7–4.0)
Monocytes Absolute: 0.6 10*3/uL (ref 0.1–1.0)
Monocytes Relative: 8 %
Neutro Abs: 4.4 10*3/uL (ref 1.7–7.7)
Neutrophils Relative %: 60 %

## 2021-10-03 LAB — APTT: aPTT: 28 seconds (ref 24–36)

## 2021-10-03 LAB — CBC
HCT: 48.7 % (ref 39.0–52.0)
Hemoglobin: 16.1 g/dL (ref 13.0–17.0)
MCH: 29.2 pg (ref 26.0–34.0)
MCHC: 33.1 g/dL (ref 30.0–36.0)
MCV: 88.4 fL (ref 80.0–100.0)
Platelets: 266 10*3/uL (ref 150–400)
RBC: 5.51 MIL/uL (ref 4.22–5.81)
RDW: 12.7 % (ref 11.5–15.5)
WBC: 7.2 10*3/uL (ref 4.0–10.5)
nRBC: 0 % (ref 0.0–0.2)

## 2021-10-03 LAB — RESP PANEL BY RT-PCR (FLU A&B, COVID) ARPGX2
Influenza A by PCR: NEGATIVE
Influenza B by PCR: NEGATIVE
SARS Coronavirus 2 by RT PCR: NEGATIVE

## 2021-10-03 LAB — PROTIME-INR
INR: 0.9 (ref 0.8–1.2)
Prothrombin Time: 12.3 seconds (ref 11.4–15.2)

## 2021-10-03 LAB — TROPONIN I (HIGH SENSITIVITY): Troponin I (High Sensitivity): 14 ng/L (ref <18)

## 2021-10-03 LAB — CBG MONITORING, ED: Glucose-Capillary: 181 mg/dL — ABNORMAL HIGH (ref 70–99)

## 2021-10-03 MED ORDER — FENTANYL BOLUS VIA INFUSION
50.0000 ug | INTRAVENOUS | Status: DC | PRN
  Filled 2021-10-03: qty 50

## 2021-10-03 MED ORDER — STROKE: EARLY STAGES OF RECOVERY BOOK
Freq: Once | Status: DC
  Filled 2021-10-03: qty 1

## 2021-10-03 MED ORDER — CHLORHEXIDINE GLUCONATE 0.12% ORAL RINSE (MEDLINE KIT)
15.0000 mL | Freq: Two times a day (BID) | OROMUCOSAL | Status: DC
  Administered 2021-10-03: 15 mL via OROMUCOSAL

## 2021-10-03 MED ORDER — LORAZEPAM 2 MG/ML IJ SOLN
1.0000 mg | Freq: Once | INTRAMUSCULAR | Status: AC
Start: 2021-10-03 — End: 2021-10-03
  Administered 2021-10-03: 18:00:00 1 mg via INTRAVENOUS
  Filled 2021-10-03: qty 1

## 2021-10-03 MED ORDER — ACETAMINOPHEN 325 MG PO TABS: 650.0000 mg | ORAL_TABLET | ORAL | Status: DC | PRN

## 2021-10-03 MED ORDER — FENTANYL CITRATE PF 50 MCG/ML IJ SOSY: 50.0000 ug | PREFILLED_SYRINGE | INTRAMUSCULAR | Status: DC | PRN

## 2021-10-03 MED ORDER — ORAL CARE MOUTH RINSE
15.0000 mL | OROMUCOSAL | Status: DC
  Administered 2021-10-03 – 2021-10-04 (×5): 15 mL via OROMUCOSAL

## 2021-10-03 MED ORDER — ACETAMINOPHEN 650 MG RE SUPP: 650.0000 mg | RECTAL | Status: DC | PRN

## 2021-10-03 MED ORDER — IOHEXOL 350 MG/ML SOLN
60.0000 mL | Freq: Once | INTRAVENOUS | Status: AC | PRN
  Administered 2021-10-03: 17:00:00 60 mL via INTRAVENOUS

## 2021-10-03 MED ORDER — DOCUSATE SODIUM 50 MG/5ML PO LIQD: 100.0000 mg | Freq: Two times a day (BID) | ORAL | Status: DC

## 2021-10-03 MED ORDER — SODIUM CHLORIDE 0.9% FLUSH
3.0000 mL | Freq: Once | INTRAVENOUS | Status: AC
  Administered 2021-10-03: 17:00:00 3 mL via INTRAVENOUS

## 2021-10-03 MED ORDER — CLEVIDIPINE BUTYRATE 0.5 MG/ML IV EMUL
0.0000 mg/h | INTRAVENOUS | Status: DC
  Filled 2021-10-03: qty 50

## 2021-10-03 MED ORDER — CLEVIDIPINE BUTYRATE 0.5 MG/ML IV EMUL
INTRAVENOUS | Status: AC
  Administered 2021-10-03: 17:00:00 1 mg/h via INTRAVENOUS
  Filled 2021-10-03: qty 50

## 2021-10-03 MED ORDER — CHLORHEXIDINE GLUCONATE 0.12% ORAL RINSE (MEDLINE KIT)
15.0000 mL | Freq: Two times a day (BID) | OROMUCOSAL | Status: DC
  Administered 2021-10-03 – 2021-10-04 (×3): 15 mL via OROMUCOSAL

## 2021-10-03 MED ORDER — FENTANYL 2500MCG IN NS 250ML (10MCG/ML) PREMIX INFUSION: 25.0000 ug/h | INTRAVENOUS | Status: DC

## 2021-10-03 MED ORDER — FENTANYL CITRATE PF 50 MCG/ML IJ SOSY
50.0000 ug | PREFILLED_SYRINGE | INTRAMUSCULAR | Status: DC | PRN
  Administered 2021-10-03: 19:00:00 50 ug via INTRAVENOUS
  Filled 2021-10-03: qty 1

## 2021-10-03 MED ORDER — SODIUM CHLORIDE 3 % IV BOLUS
250.0000 mL | Freq: Once | INTRAVENOUS | Status: AC
  Administered 2021-10-03: 19:00:00 250 mL via INTRAVENOUS
  Filled 2021-10-03: qty 500

## 2021-10-03 MED ORDER — ORAL CARE MOUTH RINSE
15.0000 mL | OROMUCOSAL | Status: DC
  Administered 2021-10-03 – 2021-10-04 (×4): 15 mL via OROMUCOSAL

## 2021-10-03 MED ORDER — ROCURONIUM BROMIDE 50 MG/5ML IV SOLN
INTRAVENOUS | Status: AC | PRN
  Administered 2021-10-03: 100 mg via INTRAVENOUS

## 2021-10-03 MED ORDER — ACETAMINOPHEN 160 MG/5ML PO SOLN
650.0000 mg | ORAL | Status: DC | PRN
  Filled 2021-10-03 (×2): qty 20.3

## 2021-10-03 MED ORDER — SENNOSIDES-DOCUSATE SODIUM 8.6-50 MG PO TABS
1.0000 | ORAL_TABLET | Freq: Two times a day (BID) | ORAL | Status: DC
  Filled 2021-10-03: qty 1

## 2021-10-03 MED ORDER — PROPOFOL 1000 MG/100ML IV EMUL
5.0000 ug/kg/min | INTRAVENOUS | Status: DC
  Administered 2021-10-03: 19:00:00 15 ug/kg/min via INTRAVENOUS
  Filled 2021-10-03 (×2): qty 100

## 2021-10-03 MED ORDER — FENTANYL CITRATE PF 50 MCG/ML IJ SOSY: 50.0000 ug | PREFILLED_SYRINGE | Freq: Once | INTRAMUSCULAR | Status: DC

## 2021-10-03 MED ORDER — INSULIN ASPART 100 UNIT/ML IJ SOLN: 0.0000 [IU] | INTRAMUSCULAR | Status: DC

## 2021-10-03 MED ORDER — CHLORHEXIDINE GLUCONATE CLOTH 2 % EX PADS
6.0000 | MEDICATED_PAD | Freq: Every day | CUTANEOUS | Status: DC
Start: 2021-10-04 — End: 2021-10-05

## 2021-10-03 MED ORDER — PANTOPRAZOLE SODIUM 40 MG IV SOLR: 40.0000 mg | Freq: Every day | INTRAVENOUS | Status: DC

## 2021-10-03 MED ORDER — LABETALOL HCL 5 MG/ML IV SOLN
10.0000 mg | Freq: Once | INTRAVENOUS | Status: AC
  Administered 2021-10-03: 17:00:00 10 mg via INTRAVENOUS

## 2021-10-03 MED ORDER — ETOMIDATE 2 MG/ML IV SOLN
INTRAVENOUS | Status: AC | PRN
  Administered 2021-10-03: 20 mg via INTRAVENOUS

## 2021-10-03 MED ORDER — SODIUM CHLORIDE 3 % IV SOLN
INTRAVENOUS | Status: DC
  Filled 2021-10-03 (×3): qty 500

## 2021-10-03 MED ORDER — POLYETHYLENE GLYCOL 3350 17 G PO PACK: 17.0000 g | PACK | Freq: Every day | ORAL | Status: DC

## 2021-10-03 NOTE — ED Provider Notes (Signed)
[facility] Provider Note   CSN: [id number] Arrival date & time: 04/16/22  1647  An emergency department physician performed an initial assessment on this suspected stroke patient at 1649.  History  Chief Complaint  Patient presents with   Code Stroke    Cyndra Numbersaul D Yurchak is a 61 y.o. male history of NSTEMI, previous intracranial hemorrhage, here presenting with left-sided neglect.  History by wife.  Patient was witnessed to have left-sided neglect and right gaze deviation around 3:30 PM Patient unable to give history.  Patient has history of intracranial hemorrhage previously.  Patient was noted to be hypertensive  The history is provided by the EMS personnel and a relative.      Home Medications Prior to Admission medications   Medication Sig Start Date End Date Taking? Authorizing Provider  acetaminophen (TYLENOL) 500 MG tablet Take 500-1,000 mg by mouth every 6 (six) hours as needed for mild pain.    [provider]  Alirocumab 75 MG/ML SOAJ Inject 75 mg into the skin every 14 (fourteen) days. 04/15/19   [provider]  aspirin 81 MG chewable tablet Chew 81 mg by mouth daily.    [provider]  atorvastatin (LIPITOR) 80 MG tablet Take 1 tablet (80 mg total) by mouth daily. 01/24/20   Corrin ParkerGoodrich, Callie E, PA-C  clopidogrel (PLAVIX) 75 MG tablet Take 1 tablet (75 mg total) by mouth daily. 01/24/20   Marjie SkiffGoodrich, Callie E, PA-C  empagliflozin (JARDIANCE) 25 MG TABS tablet Take 25 mg by mouth daily.    [provider]  losartan (COZAAR) 25 MG tablet Take 0.5 tablets (12.5 mg total) by mouth daily. 01/24/20   Corrin ParkerGoodrich, Callie E, PA-C  metFORMIN (GLUCOPHAGE-XR) 500 MG 24 hr tablet Take 1,000 mg by mouth in the morning and at bedtime.     [provider]  metoprolol succinate (TOPROL-XL) 25 MG 24 hr tablet Take 1.5 tablets (37.5 mg total) by mouth daily. 01/24/20   Corrin ParkerGoodrich, Callie E, PA-C  nitroGLYCERIN (NITROSTAT)  0.4 MG SL tablet Place 1 tablet (0.4 mg total) under the tongue every 5 (five) minutes as needed for chest pain. 01/24/20   Marjie SkiffGoodrich, Callie E, PA-C      Allergies    Codeine, Oxycodone, Penicillins, Sulfa antibiotics, Clindamycin/lincomycin, and Latex    Review of Systems   Review of Systems  Neurological:  Positive for speech difficulty and weakness.  All other systems reviewed and are negative.  Physical Exam Updated Vital Signs BP (!) 137/109    Pulse (!) 110    Temp 98.2 F (36.8 C) (Axillary)    Resp (!) 26    Wt 72.4 kg    SpO2 94%    BMI 25.76 kg/m  Physical Exam Vitals and nursing note reviewed.  Constitutional:      Comments: Altered  HENT:     Head: Normocephalic.     Nose: Nose normal.     Mouth/Throat:     Mouth: Mucous membranes are moist.  Eyes:     Pupils: Pupils are equal, round, and reactive to light.     Comments: Eye deviation to the right  Cardiovascular:     Rate and Rhythm: Normal rate and regular rhythm.     Pulses: Normal pulses.     Heart sounds: Normal heart sounds.  Pulmonary:     Effort: Pulmonary effort is normal.     Breath sounds: Normal breath sounds.  Abdominal:     General: Abdomen is flat.  Palpations: Abdomen is soft.  Musculoskeletal:        General: Normal range of motion.     Cervical back: Normal range of motion and neck supple.  Skin:    General: Skin is warm.  Neurological:     Comments: Patient has eye deviation to the right.  Patient also has left-sided neglect.  No obvious seizure-like activity  Psychiatric:     Comments: Unable     ED Results / Procedures / Treatments   Labs (all labs ordered are listed, but only abnormal results are displayed) Labs Reviewed  CBG MONITORING, ED - Abnormal; Notable for the following components:      Result Value   Glucose-Capillary 181 (*)    All other components within normal limits  I-STAT CHEM 8, ED - Abnormal; Notable for the following components:   Glucose, Bld 171 (*)     Calcium, Ion 1.13 (*)    All other components within normal limits  PROTIME-INR  APTT  CBC  DIFFERENTIAL  COMPREHENSIVE METABOLIC PANEL  HIV ANTIBODY (ROUTINE TESTING W REFLEX)  LIPID PANEL  HEMOGLOBIN A1C  CBG MONITORING, ED  TROPONIN I (HIGH SENSITIVITY)    EKG EKG Interpretation  Date/Time:  Wednesday October 03 2021 17:36:32 EST Ventricular Rate:  97 PR Interval:  135 QRS Duration: 110 QT Interval:  354 QTC Calculation: 450 R Axis:   -24 Text Interpretation: Sinus rhythm Inferolateral infarct, old No significant change since last tracing Confirmed by Richardean CanalYao, Eliasar Hlavaty H [phone]([id number]) on 09/20/2021 5:38:03 PM  Radiology CT HEAD CODE STROKE WO CONTRAST  Result Date: 10/01/2021 CLINICAL DATA:  Code stroke. Acute neuro deficit. Left-sided weakness. EXAM: CT HEAD WITHOUT CONTRAST TECHNIQUE: Contiguous axial images were obtained from the base of the skull through the vertex without intravenous contrast. RADIATION DOSE REDUCTION: This exam was performed according to the departmental dose-optimization program which includes automated exposure control, adjustment of the mA and/or kV according to patient size and/or use of iterative reconstruction technique. COMPARISON:  CT head 02/18/2017 FINDINGS: Brain: Large hemorrhage in the right posterior frontal lobe measuring approximately 6.2 x 5.7 x 5.2 cm. Estimated hematoma volume 60 mL of blood. Small amount of adjacent subarachnoid hemorrhage. There is surrounding edema and local mass-effect. 5.5 mm of midline shift to the left. No intraventricular hemorrhage . Ventricle size normal. No acute infarct or mass. Vascular: Negative for hyperdense vessel Skull: Negative Sinuses/Orbits: Paranasal sinuses clear.  Negative orbit Other: None ASPECTS ([place] Stroke Program Early CT Score) Aspect score not obtained due to intracranial hemorrhage. IMPRESSION: 1. Large volume acute hemorrhage right posterior frontal lobe with local mass-effect and 5.5 mm midline shift.  Hematoma volume 60 mL. Small amount of subarachnoid hemorrhage. No underlying infarct or tumor identified. This may represent hypertensive hemorrhage or cerebral amyloid hemorrhage. 2. These results were called by telephone at the time of interpretation on 09/16/2021 at 5:09 pm to provider Wilford CornerArora , who verbally acknowledged these results. Electronically Signed   By: Marlan Palauharles  Clark M.D.   On: 09/16/2021 17:10    Procedures Procedures   CRITICAL CARE Performed by: Richardean Canalavid H Ethin Drummond   Total critical care time: 40 minutes  Critical care time was exclusive of separately billable procedures and treating other patients.  Critical care was necessary to treat or prevent imminent or life-threatening deterioration.  Critical care was time spent personally by me on the following activities: development of treatment plan with patient and/or surrogate as well as nursing, discussions with consultants, evaluation of patient's response to treatment,  examination of patient, obtaining history from patient or surrogate, ordering and performing treatments and interventions, ordering and review of laboratory studies, ordering and review of radiographic studies, pulse oximetry and re-evaluation of patient's condition.   INTUBATION Performed by: Richardean Canal  Required items: required blood products, implants, devices, and special equipment available Patient identity confirmed: provided demographic data and hospital-assigned identification number Time out: Immediately prior to procedure a "time out" was called to verify the correct patient, procedure, equipment, support staff and site/side marked as required.  Indications: hypoxia, AMS  Intubation method: Glidescope Laryngoscopy   Preoxygenation: BVM  Sedatives: 20 mgEtomidate Paralytic: 100 mg rocuronium   Tube Size: 7.5 cuffed  Post-procedure assessment: chest rise and ETCO2 monitor Breath sounds: equal and absent over the epigastrium Tube secured with: ETT  holder Chest x-ray interpreted by radiologist and me.  Chest x-ray findings: endotracheal tube in appropriate position  Patient tolerated the procedure well with no immediate complications.    Medications Ordered in ED Medications  labetalol (NORMODYNE) injection 10 mg (10 mg Intravenous Given 09/19/2021 1657)    And  clevidipine (CLEVIPREX) infusion 0.5 mg/mL (4 mg/hr Intravenous Infusion Verify 09/26/2021 1730)   stroke: mapping our early stages of recovery book (has no administration in time range)  acetaminophen (TYLENOL) tablet 650 mg (has no administration in time range)    Or  acetaminophen (TYLENOL) 160 MG/5ML solution 650 mg (has no administration in time range)    Or  acetaminophen (TYLENOL) suppository 650 mg (has no administration in time range)  senna-docusate (Senokot-S) tablet 1 tablet (has no administration in time range)  pantoprazole (PROTONIX) injection 40 mg (has no administration in time range)  sodium chloride flush (NS) 0.9 % injection 3 mL (3 mLs Intravenous Given 09/17/2021 1707)  iohexol (OMNIPAQUE) 350 MG/ML injection 60 mL (60 mLs Intravenous Contrast Given 10/01/2021 1723)    ED Course/ Medical Decision Making/ A&P                           Medical Decision Making KYAIRE GRUENEWALD is a 61 y.o. male here presenting with left-sided neglect.  Patient has right gaze preference.  Patient is unable to look to the left.  Code stroke activated.  Neurology at bedside  5:40 PM Patient found to have intraparenchymal bleed.  Patient is started on Cleviprex drip.  Neurology to admit to the neuro ICU.  Patient's protecting his airway currently.    6:39 PM Patient is somnolent and he became very altered.  Patient just started the setting as well.  Patient was intubated by me and chest x-ray obtained.  Neurology also ordered 3% normal saline.  At this point, critical care will admit patient.  7:20 PM Neurology called neurosurgery (Dr. Yetta Barre) to evaluate patient.   7:58  PM Neurosurgery recommend repeat CT.  Patient at this point has midline shift.  They felt that patient is herniating and needs to be comfort care at this point.  They do not recommend emergent surgery.  They updated family. Patient now palliative   Problems Addressed: Nontraumatic cortical hemorrhage of cerebral hemisphere, unspecified laterality [facility]): acute illness or injury  Amount and/or Complexity of Data Reviewed Independent Historian: spouse External Data Reviewed: labs and notes. Labs: ordered. Decision-making details documented in ED Course. Radiology: ordered and independent interpretation performed. Decision-making details documented in ED Course.    Details: intra parenchymal bleed ECG/medicine tests: ordered and independent interpretation performed.  Risk Prescription drug management. Decision  regarding hospitalization.   Final Clinical Impression(s) / ED Diagnoses Final diagnoses:  Nontraumatic cortical hemorrhage of cerebral hemisphere, unspecified laterality [facility])    Rx / DC Orders ED Discharge Orders     None         Charlynne Pander, MD October 22, 2021 [phone]

## 2021-10-03 NOTE — ED Triage Notes (Signed)
Code stroke with LKN at 1530. Symptoms of left sided weakness, right eye gaze, slurred speech and AMS. Hx of stroke with no deficits.

## 2021-10-03 NOTE — Progress Notes (Signed)
Patient with worsening level of consciousness. Difficulty protecting airway. Requested ED provider for emergent intubation. Will consult PCCM for vent management.   -- Milon Dikes, MD Neurologist [facility] Pager: [phone]

## 2021-10-03 NOTE — Progress Notes (Signed)
Chaplain responded to page from nurse. Patient in comfort care. This was unexpected news to family.  His wife of 58 years is bedside as is their youngest son.  The patient's sister arrived and more family is on the way.  Chaplain offered presence and words of hope.  The patient loves blue grass music and is a man of faith.  Family is tearfully accepting the news that nothing can be done.  Chaplain will continue to be available to support family through the night.  Please page if family requests it. [place] Pager [phone]

## 2021-10-03 NOTE — Code Documentation (Signed)
Stroke Response Nurse Documentation Code Documentation  Bryan Rowland is a 61 y.o. male arriving to [facility] ED via [facility] on 2021-10-21 with past medical hx significant of hypercholesteremia, hypertension, SVT, NSTEMI, and hemorrhagic stroke. On asprin and plavix. Code stroke was activated by EMS.   Patient from home where he was LKW at 1530 and now complaining of numbness and weakness on left side. Told his wife he thought he was having another stroke.   Stroke team at the bedside on patient arrival. Labs drawn and patient cleared for CT by Dr. Silverio Lay. Patient to CT with team. NIHSS 19, see documentation for details and code stroke times. Patient with disoriented, right gaze preference , left hemianopia, left facial droop, left arm weakness, left leg weakness, left decreased sensation, and Visual  neglect on exam. The following imaging was completed: CT, CTA head and neck. Patient is not a candidate for IV Thrombolytic due to ICH. BP elevated, see documentation. Treated with 10mg  labetalol 1657, followed by cleviprex drip as BP remained elevated. Note- IV infusing drip became dislodged while in CT. Patient diaphoretic and IV dressing not staying in place. Switched to other IV prior to increasing drip to ensure patient receiving medication.     Care/Plan: Admit to ICU. Titrate cleviprex for SBP goal 130-150. VS and NIHSS hourly.  Bedside handoff with ED RN Ma Katrina.    Stroke Response RN

## 2021-10-03 NOTE — ED Notes (Signed)
Pt is very restless and moves around the bed constantly. Ativan 1mg  given to relax pt some. Pt is very jittery at this time. Will continue to monitor.

## 2021-10-03 NOTE — Sedation Documentation (Signed)
ETT 7.5, 23 at the lip.

## 2021-10-03 NOTE — ED Notes (Signed)
Pt appears to be uncomfortable. Propofol drip maxed at 50 mcg/kg at this time. Secure chat sent to neurology requesting additional medication adjustment for pt comfort. Propofol rate increased to 80 mcg/kg. No other orders.

## 2021-10-03 NOTE — Progress Notes (Signed)
Patient ID: Bryan Rowland, male   DOB: 1961/08/08, 61 y.o.   MRN: [id number]  Brief HPI:  60/M who presentes with acute onset slurred speech and left sided weakness. He was brought to the ED, wehere CT head revealed large posterior frontal lobe hemorrhage, with associated 5.80mm midline shift. Respiratory status had declined, and pt required intubation. Neurosurgery had been consulted, and they assessed pt to be a poor candidate for intervention. Family was consulted, pt made DNR, with no escalation of care.   Labs and imaging briefly reviewed.   Plan  - Serial neurochecks - Maintain SBP <140 mmHg - Continue ventilator support:      - TV 4-55ml/kg PBW, target plateau pressures <30      - Downtitrate FiO2 and PEEP to target SpO2 >90% - No transfusions indicated at this time.  - Monitor electrolytes, correct abnormalities as warranted - Maintain normoglycemia - PRN pain control - Overall prognosis grim.

## 2021-10-03 NOTE — ED Notes (Signed)
Remaining family arriving to say good-bye to pt. Pt remains intubated. Propofol drip infusing. Pt comfortable. Chaplain at bedside.

## 2021-10-03 NOTE — Progress Notes (Addendum)
Neurosurgery evaluated the patient, and unfortunately based on his exam and progression of hemorrhage on CT, based on Dr. Adah Salvage discussion with the family they felt that the patient would favor comfort measures in this situation.  He already has brainstem findings on exam, and brainstem compression on CT.  I discussed this with the family as well, and they indicated desire to change to keeping him comfortable.   In this setting, I have discontinued hypertonic saline and Cleviprex, can discontinue vital checks.  There are other family members who are coming in from outside, and therefore family wishes to support him with ventilator until they have a chance to visit.  Once they visit, we can transition to purely comfort measures.  No further escalation of care, patient is DNR.  This patient is critically ill and at significant risk of neurological worsening, death and care requires constant monitoring of vital signs, hemodynamics,respiratory and cardiac monitoring, neurological assessment, discussion with family, other specialists and medical decision making of high complexity. I spent 30 minutes of neurocritical care time  in the care of  this patient. This was time spent independent of any time provided by nurse practitioner or PA.  Roland Rack, MD [facility] [phone]  If 7pm- 7am, please page neurology on call as listed in [place]. 09/30/2021  8:40 PM

## 2021-10-03 NOTE — ED Notes (Signed)
Preparing for intubation at this time. MD, RT, 2 Rns and pharmacist at bedside.

## 2021-10-03 NOTE — H&P (Signed)
Neurology H&P  CC: slurred speech  History is obtained from:Patient, family and chart review  HPI: Bryan Rowland is a 61 y.o. male with a history of HTN, HLD, T2DM, SAH, CAD, ICH and falls who presents with slurred speech, right gaze deviation, left sided weakness and left sided neglect Prior history of spontaneous subarachnoid hemorrhage without any evidence of aneurysm [facility].  No residual deficits. History of coronary artery disease with 2 stents-on aspirin and Plavix at home. Reports compliance to medications Last known well around 3:30 PM when he started noting some numbness followed by weakness on the left side and asked wife to call EMS.  Upon EMS arrival, forced right gaze and left hemiparesis noted and he was brought in as a code stroke.   LKW: 1530 tpa given?: no, ICH ICH Score: 1 Modified Rankin Scale: 0-Completely asymptomatic and back to baseline post- stroke  ROS: Performed and negative except noted in HPI.  Past Medical History:  Diagnosis Date   Hypercholesteremia    Hypertension    SVT (supraventricular tachycardia) (HCC)      Family History  Problem Relation Age of Onset   Hypertension Father      Social History:  reports that he quit smoking about 26 years ago. He has a 75.00 pack-year smoking history. He has never used smokeless tobacco. He reports current alcohol use of about 14.0 standard drinks per week. He reports that he does not use drugs.   Exam: Current vital signs: BP (!) 191/115 (BP Location: Left Arm)    Pulse 96    Resp 15    Wt 72.4 kg    SpO2 94%    BMI 25.76 kg/m  Vital signs in last 24 hours: Pulse Rate:  [89-101] 96 (01/25 1706) Resp:  [15] 15 (01/25 1706) BP: (172-191)/(113-131) 191/115 (01/25 1706) SpO2:  [94 %-97 %] 94 % (01/25 1706) Weight:  [72.4 kg] 72.4 kg (01/25 1653)  Physical Exam  Constitutional: Appears well-developed and well-nourished.  Psych: Affect appropriate to situation Eyes: No scleral  injection HENT: No OP obstrucion Head: Normocephalic.  Cardiovascular: Normal rate and regular rhythm.  Respiratory: Effort normal  GI: Soft.  No distension.   Skin: WDI on clothed exam  Neuro: Mental Status: Patient is awake, alert, oriented to person, place, month, year, and situation. Patient is able to give an incomplete history. Cranial Nerves: II: Pupils are equal, round, and reactive to light.   III,IV, VI: EOMI without ptosis or diploplia.  VII: Facial movement is symmetric.  VIII: hearing is intact to voice XII: tongue is midline without atrophy or fasciculations.  Motor: Tone is normal. Bulk is normal. 5/5 strength was present in RUE and RLE, 0/5 strength in LUE and LLE Sensory: Sensation is intact to light touch on right side with deficit on the left  Deep Tendon Reflexes: 2+ and symmetric in the biceps and patellae.  Plantars: Toes are downgoing bilaterally   I have reviewed labs in epic and the results pertinent to this consultation are: BMP Latest Ref Rng & Units 09/21/2021 01/24/2020 01/23/2020  Glucose 70 - 99 mg/dL 409(W171(H) 119(J173(H) 478(G154(H)  BUN 8 - 23 mg/dL 23 95(A27(H) 21(H23(H)  Creatinine 0.61 - 1.24 mg/dL 0.860.90 5.781.09 4.691.15  Sodium 135 - 145 mmol/L 140 135 139  Potassium 3.5 - 5.1 mmol/L 4.0 4.0 4.4  Chloride 98 - 111 mmol/L 105 104 100  CO2 22 - 32 mmol/L - 23 25  Calcium 8.9 - 10.3 mg/dL -  8.7(L) 9.3   CBC    Component Value Date/Time   WBC 7.2 2021-10-08 1653   RBC 5.51 08-Oct-2021 1653   HGB 16.7 2021/10/08 1655   HCT 49.0 10-08-21 1655   PLT 266 10/08/21 1653   MCV 88.4 10-08-21 1653   MCH 29.2 October 08, 2021 1653   MCHC 33.1 10/08/2021 1653   RDW 12.7 10/08/21 1653   LYMPHSABS 2.0 10/08/2021 1653   MONOABS 0.6 2021/10/08 1653   EOSABS 0.2 08-Oct-2021 1653   BASOSABS 0.1 October 08, 2021 1653     I have reviewed the images obtained: CT head: Large posterior frontal lobe hemorrhage with local mass effect and 5.5 mm midline shift, hematoma volume 60 mL with  no underlying tumor or infarct  CT angio head and neck: Large hematoma in right posterior frontal lobe with interval increase in size from CT 20 minutes prior. Contrast in hematoma compatible with active bleeding, increased mass effect and 64mm midline shift.  Abnormal draining veins in right temporoparietal lobe just below hematoma compatible with vascular malformation.  No definite AVM identified.  CT venogram Negative for dural venous thrombosis, active extravasation of contrast in right sided hematoma with enlarged mass effect  Primary Diagnosis:  Right posterior frontal lobe ICH   Secondary Diagnosis: Brain compression, Essential (primary) hypertension, Hypertension Emergency (SBP > 180 or DBP > 120 & end organ damage), and Type 2 diabetes mellitus w/o complications  Assessment: 61 year old patient who was in his usual state of health until he awoke from his nap at 1530 today and c/o slurred speech.  His wife called EMS, and he was found to have right sided gaze deviation, left sided weakness and left sided neglect.  CT head demonstrated large right frontal lobe ICH.  CT angiogram demonstrates signs of active bleeding.  Urgent neurosurgery consult will be obtained for possible intervention.  Plan: Subcortical ICH, nontraumatic Cortical ICH, nontraumatic  Acuity: Acute Laterality: right Current suspected etiology: hypertension versus vascular malformation Treatment: -Admit to ICU -ICH Score:1 -ICH Volume: 60 mL -BP control goal SYS<140 -NSGY Consult for hematoma evacuation/decompressive hemicraniotomy as well as vascular malformation. -PT/OT/ST  -neuromonitoring  CNS Cerebral edema with midline shift -Hypertonic 3% with goal Na 150-155 -NSGY consult-spoke with APP-neurosurgery will evaluate the patient. -Close neuro monitoring  Dysarthria Dysphagia following ICH  -NPO until cleared by speech -ST -Advance diet as tolerated  Hemiplegia and hemiparesis following nontraumatic  intracerebral hemorrhage affecting left non-dominant side  -Continue PT/OT/ST  RESP Risk for respiratory failure -admit to ICU -closely monitor respiratory status  CV Hypertensive Emergency -Aggressive BP control, goal SBP < 140 -Cleviprex for BP control  H/O CAD -12 lea EKG, serial trops  HEME No active issues Check CBC in the morning   ENDO Type 2 diabetes mellitus with hyperglycemia  -SSI -goal HgbA1c < 7  Fluid/Electrolyte Disorders Check labs in the morning.  Replete as necessary  ID Possible Aspiration PNA -CXR -NPO -Monitor  Prophylaxis DVT: SCDs GI: PPI Bowel: Docusate senna  Dispo: pending  Diet: NPO until cleared by speech/bedside swallow eval  Code Status: Full Code     THE FOLLOWING WERE PRESENT ON ADMISSION: Intracerebral hemorrhage, hemiplegia, possible aspiration pneumonia  Cortney E Ernestina Columbia , MSN, AGACNP-BC [facility] See Amion for schedule and pager information 2021-10-08 6:08 PM   Attending addendum Patient seen and examined as an acute code stroke Patient with a prior history of a spontaneous subarachnoid hemorrhage without residual deficits and angiogram done at [facility] with no aneurysm-usual state  of health this afternoon when he had sudden onset of left-sided tingling followed by weakness and slurred speech.  He asked his wife to call EMS who arrived and on their exam he had a forced right gaze and flaccid left-sided paralysis.  He was brought in as an acute code stroke and seen and examined at the bridge.  Last known well 1530 hrs.  On examination: Awake alert severely dysarthric speech, forced right gaze deviation, equal pupils, left facial droop, minimal movement of the left upper extremity to noxious stimulation, left lower extremity 2/5-3/5.  Right side full strength.  Sensory loss on the left as well with extinction to double simultaneous stimulation and complete neglect of his own hand when brought forward to him.   NIH stroke scale 18 Hypertensive on arrival requiring labetalol as needed and starting Cleviprex drip.  CT head: Greater than 60 cc acute hemorrhage in the right posterior frontal lobe with local mass-effect and 5 mm midline shift with small amount of subarachnoid hemorrhage.  No underlying infarct or tumor.  Hypertensive hemorrhage versus CVA. CT angiography head and neck: With some increase in the size of the ICH now with 8 mm shift to the left.  CT angiogram with abnormal draining veins in the right parietal lobe just below the hematoma-could be compatible with a vascular malformation-no definitive AVM identified-may be a fistula-no evidence of aneurysm. CT venogram-no dural venous sinus thrombus  Assessment: Right posterior frontal lobe ICH-with likely continuing bleeding with cerebral edema and mass-effect with left-sided shift-hypertensive versus vascular malformation versus cerebral amyloid angiopathy  Hypertensive emergency  Possible aspiration pneumonia  Plan: - Strict blood pressure management as above - No antiplatelets anticoagulants - Stat neurosurgical consultation-spoke with APP on service-neurosurgery will evaluate. - Repeat head CT in 4 hours - Possible diagnosis able angiogram for evaluation of underlying vascular malformation - MRI brain with and without contrast-may also aid in the diagnosis of the malformation - Chest x-ray, n.p.o.-May need antibiotics for aspiration pneumonia - High risk for aspiration given the large size of the bleed-we will repeat CT head in 4 hours.  Will sign out care to the oncoming neuro hospitalist. - Hyperosmolar therapy-bolus to 50 cc 3% saline followed by 75 cc an hour.  Goal sodium 1 50-1 55. Plan discussed with Dr. Silverio Lay and neurosurgical APP Verlin Dike.  -- Milon Dikes, MD Neurologist [facility] Pager: [phone]  CRITICAL CARE ATTESTATION Performed by: Milon Dikes, MD Total critical care time: 60  minutes Critical care time was exclusive of separately billable procedures and treating other patients and/or supervising APPs/Residents/Students Critical care was necessary to treat or prevent imminent or life-threatening deterioration due to ICH  This patient is critically ill and at significant risk for neurological worsening and/or death and care requires constant monitoring. Critical care was time spent personally by me on the following activities: development of treatment plan with patient and/or surrogate as well as nursing, discussions with consultants, evaluation of patient's response to treatment, examination of patient, obtaining history from patient or surrogate, ordering and performing treatments and interventions, ordering and review of laboratory studies, ordering and review of radiographic studies, pulse oximetry, re-evaluation of patient's condition, participation in multidisciplinary rounds and medical decision making of high complexity in the care of this patient.

## 2021-10-03 NOTE — ED Notes (Signed)
Pt transported to CT3 with RT for stat CT head

## 2021-10-03 NOTE — Consult Note (Signed)
NAME:  Bryan Rowland, MRN:  [id number], DOB:  [birth year], LOS: 0 ADMISSION DATE:  10-08-2021, CONSULTATION DATE:  2021-10-08 REFERRING MD:  Wilford Corner, CHIEF COMPLAINT:  slurred speech   History of Present Illness:  50 year man with history of metabolic syndrome, SAH, prior ICH, frequent falls, CAD who presented with left hemiparesis, forced right gaze.  Imaging showed large frontal lobe hemorrhage.  NSGY to be consulted.  Unfortunately patient became more unresponsive and is requiring intubation.  PCCM is consulted for ventilator management.  History per chart review as patient is being intubated.  Pertinent  Medical History  Prior subarachonoid hemorrhage SVT HTN Ischemic cardiomyopathy CAD s/p PCI to RVA with residual LAD, LCx DM2  Significant Hospital Events: Including procedures, antibiotic start and stop dates in addition to other pertinent events   October 08, 2021 admission, intubation  Interim History / Subjective:  Consulted  Objective   Blood pressure (!) 184/116, pulse (!) 110, temperature 98.2 F (36.8 C), temperature source Axillary, resp. rate (!) 34, weight 72.4 kg, SpO2 (!) 88 %.        Intake/Output Summary (Last 24 hours) at 10-08-2021 1835 Last data filed at October 08, 2021 1730 Gross per 24 hour  Intake 1.57 ml  Output --  Net 1.57 ml   Filed Weights   October 08, 2021 1653  Weight: 72.4 kg    Examination: Attending:    General:  In bed on vent HENT: NCAT ETT in place PULM: Diminished bilaterally B, vent supported breathing CV: RRR, no mgr GI: BS+, soft, nontender MSK: normal bulk and tone Neuro: sedated, paralyzed on vent    Personally reviewed:  BMP normal Flu/COVID pending POC glucose 181 CBC normal Post intubation CXR pending CT head personally reviewed  Resolved Hospital Problem list   N/A  Assessment & Plan:  Right frontoparietal intracranial hemorrhage- 5.78mm midline shift Acute hypoxemic respiratory failure due to intracranial hemorrhage causing  inability to protect airway Encephalopathy related to above Need for sedation for mechanical ventilation History of CAD with PCI to RCA, residual 2 vessel disease and cardiomyopathy Diaphoresis and questionable chest pain prior to intubation DM2 with hyperglycemia  - cycle trops, f/u echo - SBP goal < 140 - f/u NSGY recommendations - LTVV with Vt 4-8cc/kg ideal body weight, limit DP as able - VAP prevention bundle - SSI - Seizure precautions - Will follow with you  Best Practice (right click and "Reselect all SmartList Selections" daily)   Diet/type: NPO DVT prophylaxis: SCD GI prophylaxis: PPI Lines: N/A Foley:  Yes, and it is still needed Code Status:  full code Last date of multidisciplinary goals of care discussion [Per primary]  Labs   CBC: Recent Labs  Lab October 08, 2021 1653 Oct 08, 2021 1655  WBC 7.2  --   NEUTROABS 4.4  --   HGB 16.1 16.7  HCT 48.7 49.0  MCV 88.4  --   PLT 266  --     Basic Metabolic Panel: Recent Labs  Lab 10/08/2021 1653 10/08/21 1655  NA 137 140  K 4.2 4.0  CL 105 105  CO2 21*  --   GLUCOSE 174* 171*  BUN 18 23  CREATININE 0.98 0.90  CALCIUM 9.2  --    GFR: CrCl cannot be calculated (Unknown ideal weight.). Recent Labs  Lab 10-08-2021 1653  WBC 7.2    Liver Function Tests: Recent Labs  Lab 10/08/21 1653  AST 29  ALT 21  ALKPHOS 76  BILITOT 1.0  PROT 6.7  ALBUMIN 4.1   No results for  input(s): LIPASE, AMYLASE in the last 168 hours. No results for input(s): AMMONIA in the last 168 hours.  ABG    Component Value Date/Time   PHART 7.272 (L) 01/21/2020 1827   PCO2ART 39.3 01/21/2020 1827   PO2ART 53 (L) 01/21/2020 1827   HCO3 18.1 (L) 01/21/2020 1827   TCO2 25 11-02-2021 1655   ACIDBASEDEF 8.0 (H) 01/21/2020 1827   O2SAT 83.0 01/21/2020 1827     Coagulation Profile: Recent Labs  Lab November 02, 2021 1653  INR 0.9    Cardiac Enzymes: No results for input(s): CKTOTAL, CKMB, CKMBINDEX, TROPONINI in the last 168  hours.  HbA1C: Hgb A1c MFr Bld  Date/Time Value Ref Range Status  01/21/2020 06:06 PM 7.8 (H) 4.8 - 5.6 % Final    Comment:    (NOTE) Pre diabetes:          5.7%-6.4% Diabetes:              >6.4% Glycemic control for   <7.0% adults with diabetes     CBG: Recent Labs  Lab 2021-11-02 1649  GLUCAP 181*    Review of Systems:   Intubated  Past Medical History:  He,  has a past medical history of Hypercholesteremia, Hypertension, and SVT (supraventricular tachycardia) (HCC).   Surgical History:   Past Surgical History:  Procedure Laterality Date   CORONARY/GRAFT ACUTE MI REVASCULARIZATION N/A 01/21/2020   Procedure: Coronary/Graft Acute MI Revascularization;  Surgeon: Yvonne Kendall, MD;  Location: [facility] INVASIVE CV LAB;  Service: Cardiovascular;  Laterality: N/A;   INTRAVASCULAR ULTRASOUND/IVUS N/A 01/21/2020   Procedure: Intravascular Ultrasound/IVUS;  Surgeon: Yvonne Kendall, MD;  Location: [facility] INVASIVE CV LAB;  Service: Cardiovascular;  Laterality: N/A;   LEFT HEART CATH AND CORONARY ANGIOGRAPHY N/A 01/21/2020   Procedure: LEFT HEART CATH AND CORONARY ANGIOGRAPHY;  Surgeon: Yvonne Kendall, MD;  Location: [facility] INVASIVE CV LAB;  Service: Cardiovascular;  Laterality: N/A;   ROTATOR CUFF REPAIR  2010   left     Social History:   reports that he quit smoking about 26 years ago. He has a 75.00 pack-year smoking history. He has never used smokeless tobacco. He reports current alcohol use of about 14.0 standard drinks per week. He reports that he does not use drugs.   Family History:  His family history includes Hypertension in his father.   Allergies Allergies  Allergen Reactions   Codeine Itching   Oxycodone Itching   Penicillins Anaphylaxis   Sulfa Antibiotics Other (See Comments)    unknown   Clindamycin/Lincomycin Itching   Latex Rash     Home Medications  Prior to Admission medications   Medication Sig Start Date End Date Taking? Authorizing Provider   acetaminophen (TYLENOL) 500 MG tablet Take 500-1,000 mg by mouth every 6 (six) hours as needed for mild pain.    [provider]  Alirocumab 75 MG/ML SOAJ Inject 75 mg into the skin every 14 (fourteen) days. 04/15/19   [provider]  aspirin 81 MG chewable tablet Chew 81 mg by mouth daily.    [provider]  atorvastatin (LIPITOR) 80 MG tablet Take 1 tablet (80 mg total) by mouth daily. 01/24/20   Corrin Parker, PA-C  clopidogrel (PLAVIX) 75 MG tablet Take 1 tablet (75 mg total) by mouth daily. 01/24/20   Marjie Skiff E, PA-C  empagliflozin (JARDIANCE) 25 MG TABS tablet Take 25 mg by mouth daily.    [provider]  losartan (COZAAR) 25 MG tablet Take 0.5 tablets (12.5 mg total) by  mouth daily. 01/24/20   Corrin ParkerGoodrich, Callie E, PA-C  metFORMIN (GLUCOPHAGE-XR) 500 MG 24 hr tablet Take 1,000 mg by mouth in the morning and at bedtime.     [provider]  metoprolol succinate (TOPROL-XL) 25 MG 24 hr tablet Take 1.5 tablets (37.5 mg total) by mouth daily. 01/24/20   Corrin ParkerGoodrich, Callie E, PA-C  nitroGLYCERIN (NITROSTAT) 0.4 MG SL tablet Place 1 tablet (0.4 mg total) under the tongue every 5 (five) minutes as needed for chest pain. 01/24/20   Corrin ParkerGoodrich, Callie E, PA-C     Critical care time: 31 minutes    Heber CarolinaBrent Ivy Puryear, MD [facility] Pager: [phone] Cell: [phone] After 7:00 pm call Elink  [phone]

## 2021-10-03 NOTE — ED Notes (Signed)
Kirkpatrick at bedside. Verbal order to transition to comfort care and discontinue cleviprex and 3% saline infusion. Monitor transitioned to comfort care setting. Son Lorne Skeens and Wife Jasmine December remain at bedside

## 2021-10-03 NOTE — ED Notes (Signed)
Neuro surgery at bedside.

## 2021-10-03 NOTE — ED Notes (Signed)
Pt started snoring loudly at this time. O2 92-94% on RA. Dr.Yao is at bedside. Preparing to intubate. RT notified.

## 2021-10-03 NOTE — Consult Note (Addendum)
Reason for Consult:ICH Referring Physician: Kimmy Parish is an 61 y.o. male.   HPI:  61 year old male presented today to the ED with left sided weakness, slurred speech and right sided gaze. His wife accompanies him today. On aspirin and plavix for two stents. His symptoms started around 3:30 today. CT head and angiogram were ordered. Patient was intubated for airway protection.   Past Medical History:  Diagnosis Date   Hypercholesteremia    Hypertension    SVT (supraventricular tachycardia) (HCC)     Past Surgical History:  Procedure Laterality Date   CORONARY/GRAFT ACUTE MI REVASCULARIZATION N/A 01/21/2020   Procedure: Coronary/Graft Acute MI Revascularization;  Surgeon: Yvonne Kendall, MD;  Location: [facility];  Service: Cardiovascular;  Laterality: N/A;   INTRAVASCULAR ULTRASOUND/IVUS N/A 01/21/2020   Procedure: Intravascular Ultrasound/IVUS;  Surgeon: Yvonne Kendall, MD;  Location: [facility];  Service: Cardiovascular;  Laterality: N/A;   LEFT HEART CATH AND CORONARY ANGIOGRAPHY N/A 01/21/2020   Procedure: LEFT HEART CATH AND CORONARY ANGIOGRAPHY;  Surgeon: Yvonne Kendall, MD;  Location: [facility];  Service: Cardiovascular;  Laterality: N/A;   ROTATOR CUFF REPAIR  2010   left    Allergies  Allergen Reactions   Codeine Itching   Oxycodone Itching   Penicillins Anaphylaxis   Sulfa Antibiotics Other (See Comments)    unknown   Clindamycin/Lincomycin Itching   Latex Rash    Social History   Tobacco Use   Smoking status: Former    Packs/day: 3.00    Years: 25.00    Pack years: 75.00    Types: Cigarettes    Quit date: 02/27/1995    Years since quitting: 26.6   Smokeless tobacco: Never  Substance Use Topics   Alcohol use: Yes    Alcohol/week: 14.0 standard drinks    Types: 14 Shots of liquor per week    Family History  Problem Relation Age of Onset   Hypertension Father      Review of Systems  Positive ROS: intubated and  sedated  All other systems have been reviewed and were otherwise negative with the exception of those mentioned in the HPI and as above.  Objective: Vital signs in last 24 hours: Temp:  [98.2 F (36.8 C)] 98.2 F (36.8 C) (01/25 1735) Pulse Rate:  [89-110] 90 (01/25 1855) Resp:  [14-26] 16 (01/25 1855) BP: (137-204)/(86-131) 164/95 (01/25 1855) SpO2:  [92 %-97 %] 96 % (01/25 1855) FiO2 (%):  [100 %] 100 % (01/25 1835) Weight:  [72.4 kg] 72.4 kg (01/25 1653)  General Appearance: intubated and sedated Head: Normocephalic, without obvious abnormality, atraumatic Eyes: Right 5 nonreactive , left 4 sluggish  Lungs: intubated, respirations unlabored Heart: Regular rate and rhythm Extremities: Extremities normal, atraumatic, no cyanosis or edema Pulses: 2+ and symmetric all extremities Skin: Skin color, texture, turgor normal, no rashes or lesions  NEUROLOGIC:   Mental status: intubated and sedated  Motor Exam - decorticate posturing  Sensory Exam - UTA Reflexes: upgoing babinski Coordination - UTA Gait - UTA Balance - UTA Cranial Nerves: I: smell Not tested  II: visual acuity  OS: na    OD: na  II: visual fields UTA  II: pupils Right 5 nonreactive, left 4 sluggish  III,VII: ptosis None  III,IV,VI: extraocular muscles  uta  V: mastication uta  V: facial light touch sensation  uta  V,VII: corneal reflex  uta  VII: facial muscle function - upper  uta  VII: facial muscle  function - lower uta  VIII: hearing uta  IX: soft palate elevation  uta  IX,X: gag reflex uta  XI: trapezius strength  uta  XI: sternocleidomastoid strength uta  XI: neck flexion strength  uta  XII: tongue strength  uta    Data Review Lab Results  Component Value Date   WBC 7.2 09/20/2021   HGB 16.7 09/16/2021   HCT 49.0 10/08/2021   MCV 88.4 09/21/2021   PLT 266 09/11/2021   Lab Results  Component Value Date   NA 140 09/21/2021   K 4.0 09/21/2021   CL 105 09/19/2021   CO2 21 (L) 10/02/2021    BUN 23 10/01/2021   CREATININE 0.90 10/08/2021   GLUCOSE 171 (H) 10/04/2021   Lab Results  Component Value Date   INR 0.9 10/07/2021    Radiology: CT ANGIO HEAD NECK W WO CM  Result Date: 09/20/2021 CLINICAL DATA:  Intracranial hemorrhage EXAM: CT ANGIOGRAPHY HEAD AND NECK TECHNIQUE: Multidetector CT imaging of the head and neck was performed using the standard protocol during bolus administration of intravenous contrast. Multiplanar CT image reconstructions and MIPs were obtained to evaluate the vascular anatomy. Carotid stenosis measurements (when applicable) are obtained utilizing NASCET criteria, using the distal internal carotid diameter as the denominator. RADIATION DOSE REDUCTION: This exam was performed according to the departmental dose-optimization program which includes automated exposure control, adjustment of the mA and/or kV according to patient size and/or use of iterative reconstruction technique. CONTRAST:  60mL OMNIPAQUE IOHEXOL 350 MG/ML SOLN COMPARISON:  CT head 09/14/2021 FINDINGS: CT HEAD FINDINGS Postcontrast scanning was performed of the head following the CT angiogram. There is interval progression of hemorrhage in the right posterior frontal lobe. There is high-density contrast within the hematoma medially and posteriorly. Hematoma now measures 6.6 x 3.8 cm. Increased mass-effect and midline shift which is now 8 mm. Small amount of subarachnoid hemorrhage again noted. Review of the MIP images confirms the above findings CTA NECK FINDINGS Aortic arch: Mild atherosclerotic calcification aortic arch. Proximal great vessels widely patent. Left vertebral artery origin from the arch. Right carotid system: Mild atherosclerotic disease right carotid bifurcation without stenosis. Left carotid system: Left carotid bifurcation widely patent without atherosclerotic disease. Small calcification left internal carotid artery below the skull base without significant stenosis Vertebral  arteries: Left vertebral artery dominant and arising from the aortic arch. Left vertebral artery is patent to the skull base without stenosis. Mild stenosis at the foramen magnum. Non dominant right vertebral artery is small and ends as PICA. Skeleton: No acute skeletal abnormality. Other neck: Negative for mass or adenopathy Upper chest: Lung apices clear bilaterally. Review of the MIP images confirms the above findings CTA HEAD FINDINGS Anterior circulation: Mild atherosclerotic disease in the cavernous carotid bilaterally. No significant stenosis or aneurysm. Anterior and middle cerebral arteries patent bilaterally without stenosis. Early draining veins are noted in the right temporoparietal lobe. These appear to be arising from middle cerebral artery branches and extend to the cortex laterally. No aneurysm identified. No definite AVM identified. These abnormal veins are just below the large hematoma. Posterior circulation: Left vertebral artery supplies the basilar. Mild stenosis distal left vertebral artery. Basilar widely patent. Superior cerebellar and posterior cerebral arteries patent bilaterally without stenosis or vascular malformation. Venous sinuses: Normal venous enhancement Anatomic variants: None Review of the MIP images confirms the above findings IMPRESSION: 1. Large hematoma in the right posterior frontal lobe shows interval increase in size from approximately 20 minutes prior. There is contrast within  the hematoma compatible with active bleeding. There is increased mass-effect and midline shift which is now 8 mm towards the left. 2. CT angiogram demonstrates abnormal draining veins in the right temporoparietal lobe just below the hematoma compatible with a vascular malformation. No definite AVM is identified. This may be a fistula. No aneurysm. 3. No significant carotid or vertebral artery stenosis in the neck. Right vertebral artery ends in PICA. 4. These results were called by telephone at the  time of interpretation on 09/14/2021 at 5:48 pm to provider [facility] AR[facility]A , who verbally acknowledged these results. Electronically Signed   By: Marlan Palauharles  Clark M.D.   On: 10/09/2021 17:50   DG Chest Port 1 View  Result Date: 09/12/2021 CLINICAL DATA:  Chest pain EXAM: PORTABLE CHEST 1 VIEW COMPARISON:  02/18/2017 FINDINGS: Cardiomegaly, vascular congestion. No confluent opacities, effusions or edema. No acute bony abnormality. IMPRESSION: Cardiomegaly, vascular congestion. Electronically Signed   By: Charlett NoseKevin  Dover M.D.   On: 10/02/2021 18:09   CT VENOGRAM HEAD  Result Date: 09/18/2021 CLINICAL DATA:  Intracranial hemorrhage. EXAM: CT VENOGRAM HEAD TECHNIQUE: Venographic phase images of the brain were obtained following the administration of intravenous contrast. Multiplanar reformats and maximum intensity projections were generated. RADIATION DOSE REDUCTION: This exam was performed according to the departmental dose-optimization program which includes automated exposure control, adjustment of the mA and/or kV according to patient size and/or use of iterative reconstruction technique. CONTRAST:  60mL OMNIPAQUE IOHEXOL 350 MG/ML SOLN COMPARISON:  CT head proximally 20 minutes prior FINDINGS: Large hematoma in the right posterior temporal lobe shows active extravasation of contrast and enlargement of the hematoma. Increased mass-effect and midline shift. Midline shift now 8 mm. Mild amount of subarachnoid hemorrhage. No intraventricular hemorrhage. Superior sagittal sinus is widely patent without thrombosis. Straight sinus patent. Transverse sinus and sigmoid sinus widely patent bilaterally. IMPRESSION: 1. Negative for dural venous thrombosis. 2. Active extravasation of contrast in the right sided hematoma which has enlarged with increased mass-effect. 3. These results were called by telephone at the time of interpretation on 09/29/2021 at 5:52 pm to provider Wilford CornerArora , who verbally acknowledged these results.  Electronically Signed   By: Marlan Palauharles  Clark M.D.   On: 10/01/2021 17:53   CT HEAD CODE STROKE WO CONTRAST  Result Date: 09/11/2021 CLINICAL DATA:  Code stroke. Acute neuro deficit. Left-sided weakness. EXAM: CT HEAD WITHOUT CONTRAST TECHNIQUE: Contiguous axial images were obtained from the base of the skull through the vertex without intravenous contrast. RADIATION DOSE REDUCTION: This exam was performed according to the departmental dose-optimization program which includes automated exposure control, adjustment of the mA and/or kV according to patient size and/or use of iterative reconstruction technique. COMPARISON:  CT head 02/18/2017 FINDINGS: Brain: Large hemorrhage in the right posterior frontal lobe measuring approximately 6.2 x 5.7 x 5.2 cm. Estimated hematoma volume 60 mL of blood. Small amount of adjacent subarachnoid hemorrhage. There is surrounding edema and local mass-effect. 5.5 mm of midline shift to the left. No intraventricular hemorrhage . Ventricle size normal. No acute infarct or mass. Vascular: Negative for hyperdense vessel Skull: Negative Sinuses/Orbits: Paranasal sinuses clear.  Negative orbit Other: None ASPECTS ([place] Stroke Program Early CT Score) Aspect score not obtained due to intracranial hemorrhage. IMPRESSION: 1. Large volume acute hemorrhage right posterior frontal lobe with local mass-effect and 5.5 mm midline shift. Hematoma volume 60 mL. Small amount of subarachnoid hemorrhage. No underlying infarct or tumor identified. This may represent hypertensive hemorrhage or cerebral amyloid hemorrhage. 2. These results were called  by telephone at the time of interpretation on 2021-11-01 at 5:09 pm to provider Wilford Corner , who verbally acknowledged these results. Electronically Signed   By: Marlan Palau M.D.   On: Nov 01, 2021 17:10     Assessment/Plan: 61 year old male presented to the ED tonight with left sided weakness and slurred speech. Head CT shows a very large right sided  frontal lobe ICH with local mass effect and midline shift. CTA shows abnormal draining veins in the right temporoparietal lobe with possible AVM. We will get a stat head CT now to see if his bleed has increased in size. Possible interventions include angiogram to look at the possible avm closer or crani for evacuation of ICH. A crani at this point would be high risk with the unknown avm and that fact that he is on aspirin and plavix. We will get the head CT now and make a decision based on the results of this.    Tiana Loft Dessiree Sze 11/01/21 7:02 PM

## 2021-10-03 NOTE — ED Notes (Signed)
Attempted OGT multiple times with no success. Will attempt again.

## 2021-10-03 NOTE — ED Notes (Addendum)
Neurosurgery discussing repeat CT scan with wife. Son Oshen Wlodarczyk brought back to room. Chaplin paged by Diplomatic Services operational officer. Verbal order discontinue OG

## 2021-10-03 NOTE — ED Notes (Signed)
Report given to Neuro ICU Room 4N21 RN [facility]. RT called for transport. Awaiting RT to transport pt.

## 2021-10-04 DIAGNOSIS — G935 Compression of brain: Secondary | ICD-10-CM

## 2021-10-04 LAB — MRSA NEXT GEN BY PCR, NASAL: MRSA by PCR Next Gen: NOT DETECTED

## 2021-10-04 MED ORDER — MORPHINE 100MG IN NS 100ML (1MG/ML) PREMIX INFUSION
2.0000 mg/h | INTRAVENOUS | Status: DC
  Administered 2021-10-04: 2 mg/h via INTRAVENOUS
  Filled 2021-10-04: qty 100

## 2021-10-04 MED ORDER — POLYVINYL ALCOHOL 1.4 % OP SOLN
1.0000 [drp] | OPHTHALMIC | Status: DC | PRN
  Filled 2021-10-04: qty 15

## 2021-10-04 MED ORDER — BISACODYL 10 MG RE SUPP: 10.0000 mg | Freq: Every day | RECTAL | Status: DC | PRN

## 2021-10-04 MED ORDER — GLYCOPYRROLATE 0.2 MG/ML IJ SOLN
0.2000 mg | INTRAMUSCULAR | Status: DC | PRN
  Administered 2021-10-05: 0.2 mg via INTRAVENOUS
  Filled 2021-10-04 (×2): qty 1

## 2021-10-04 MED ORDER — MORPHINE BOLUS VIA INFUSION
2.0000 mg | INTRAVENOUS | Status: DC | PRN
  Filled 2021-10-04: qty 2

## 2021-10-04 MED ORDER — ACETAMINOPHEN 160 MG/5ML PO SOLN
650.0000 mg | Freq: Four times a day (QID) | ORAL | Status: DC | PRN
  Filled 2021-10-04: qty 20.3

## 2021-10-04 MED ORDER — DEXAMETHASONE SODIUM PHOSPHATE 4 MG/ML IJ SOLN
4.0000 mg | Freq: Four times a day (QID) | INTRAMUSCULAR | Status: DC | PRN
  Filled 2021-10-04: qty 1

## 2021-10-04 NOTE — Procedures (Signed)
Extubation Procedure Note  Patient Details:   Name: Bryan Rowland DOB: 1961-05-14 MRN: [id number]   Airway Documentation:  Airway 7.5 mm (Active)  Secured at (cm) 23 cm 10/04/21 0002  Measured From Lips 10/04/21 0002  Secured Location Right 10/04/21 0002  Secured By [employer] 10/04/21 0002  Tube Holder Repositioned Yes 10-13-21 1946  Prone position No October 13, 2021 1946  Cuff Pressure (cm H2O) Clear OR 27-39 CmH2O 10/04/21 0002  Site Condition Dry 10/04/21 0002   Vent end date: 10/04/21 Vent end time: 0222   Evaluation  O2 sats: currently acceptable Complications: No apparent complications Patient did tolerate procedure well. Bilateral Breath Sounds: Clear  Terminal Extubation  No  Leafy Half 10/04/2021, 2:23 AM

## 2021-10-04 NOTE — Progress Notes (Signed)
°  Transition of Care [facility]) Screening Note   Patient Details  Name: Bryan Rowland Date of Birth: Jul 14, 1961   Transition of Care [facility]) CM/SW Contact:    Glennon Mac, RN Phone Number: 10/04/2021, 5:29 PM    Transition of Care Department [facility]) has reviewed patient and no TOC needs have been identified at this time. We will continue to monitor patient advancement through interdisciplinary progression rounds. If new patient transition needs arise, please place a TOC consult.   Quintella Baton, RN, BSN  Trauma/Neuro ICU Case Manager [phone]

## 2021-10-04 NOTE — Progress Notes (Signed)
PCCM brief progress note:  Documentation over the past 24 hours reviewed.  Neurosurgery feel that this is a devastating hemorrhage and all treatment would be futile.  After discussion with the medical teams the family elected to pursue interventions focused on comfort.  The patient was extubated overnight and started on medications focused on comfort.   Family currently at the bedside.   PCCM is available for further assistance if needed.  Gershon Mussel., MSN, APRN, AGACNP-BC [facility]  10/04/2021 , 7:12 AM  Please see [email or web address] for pager details  If no response, please call [phone] After hours, please call Elink at [phone]

## 2021-10-04 NOTE — Progress Notes (Addendum)
°   10/04/21 1200  Clinical Encounter Type  Visited With Family;Patient not available  Visit Type Initial;Spiritual support  Referral From Nurse  Consult/Referral To None   Chaplain responded to a spiritual consult for family member.  I visited with sister of patient and a friend. The sister commented that she was talking with the patient hoping that something may register. The family member who was seeking the consult was not present, but is expected back shortly. I will return and if she is there I will visit with her at that time.   Valerie Roys Chaplain Resident  [facility]  [phone]

## 2021-10-04 NOTE — Progress Notes (Addendum)
STROKE TEAM PROGRESS NOTE   INTERVAL HISTORY Patient is seen with his sister and a family friend at the bedside.  He was admitted yesterday with slurred speech, right gaze deviation, left sided weakness and left sided neglect.  His family reported that he was in his usual state of health until he awoke from a nap at 1530 and c/o slurred speech.  EMS was called, and patient presented to the ED.  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  Patient then decompensated and required intubation.  Neurosurgery was consulted, but felt that in the setting of this devastating injury that interventions would not be helpful.  Repeat head CT showed enlargement of ICH.  After discussion with patient's family, he was transitioned to comfort care.  Vitals:   10/04/21 0900 10/04/21 1000 10/04/21 1100 10/04/21 1200  BP:      Pulse: 83 80 85 87  Resp: (!) 28 (!) 27 (!) 23 (!) 22  Temp:      TempSrc:      SpO2: 91% (!) 89% 90% 90%  Weight:      Height:       CBC:  Recent Labs  Lab 2022/02/14 1653 2022/02/14 1655  WBC 7.2  --   NEUTROABS 4.4  --   HGB 16.1 16.7  HCT 48.7 49.0  MCV 88.4  --   PLT 266  --    Basic Metabolic Panel:  Recent Labs  Lab 2022/02/14 1653 2022/02/14 1655  NA 137 140  K 4.2 4.0  CL 105 105  CO2 21*  --   GLUCOSE 174* 171*  BUN 18 23  CREATININE 0.98 0.90  CALCIUM 9.2  --    Lipid Panel: No results for input(s): CHOL, TRIG, HDL, CHOLHDL, VLDL, LDLCALC in the last 168 hours. HgbA1c: No results for input(s): HGBA1C in the last 168 hours. Urine Drug Screen: No results for input(s): LABOPIA, COCAINSCRNUR, LABBENZ, AMPHETMU, THCU, LABBARB in the last 168 hours.  Alcohol Level No results for input(s): ETH in the last 168 hours.  IMAGING past 24 hours CT ANGIO HEAD NECK W WO CM  Result Date: 04/01/2022 CLINICAL DATA:  Intracranial hemorrhage EXAM: CT ANGIOGRAPHY HEAD AND NECK TECHNIQUE: Multidetector CT imaging of the head and  neck was performed using the standard protocol during bolus administration of intravenous contrast. Multiplanar CT image reconstructions and MIPs were obtained to evaluate the vascular anatomy. Carotid stenosis measurements (when applicable) are obtained utilizing NASCET criteria, using the distal internal carotid diameter as the denominator. RADIATION DOSE REDUCTION: This exam was performed according to the departmental dose-optimization program which includes automated exposure control, adjustment of the mA and/or kV according to patient size and/or use of iterative reconstruction technique. CONTRAST:  60mL OMNIPAQUE IOHEXOL 350 MG/ML SOLN COMPARISON:  CT head 007/24/2023 FINDINGS: CT HEAD FINDINGS Postcontrast scanning was performed of the head following the CT angiogram. There is interval progression of hemorrhage in the right posterior frontal lobe. There is high-density contrast within the hematoma medially and posteriorly. Hematoma now measures 6.6 x 3.8 cm. Increased mass-effect and midline shift which is now 8 mm. Small amount of subarachnoid hemorrhage again noted. Review of the MIP images confirms the above findings CTA NECK FINDINGS Aortic arch: Mild atherosclerotic calcification aortic arch. Proximal great vessels widely patent. Left vertebral artery origin from the arch. Right carotid system: Mild atherosclerotic disease right carotid bifurcation without stenosis. Left carotid system: Left carotid bifurcation widely patent without atherosclerotic  disease. Small calcification left internal carotid artery below the skull base without significant stenosis Vertebral arteries: Left vertebral artery dominant and arising from the aortic arch. Left vertebral artery is patent to the skull base without stenosis. Mild stenosis at the foramen magnum. Non dominant right vertebral artery is small and ends as PICA. Skeleton: No acute skeletal abnormality. Other neck: Negative for mass or adenopathy Upper chest: Lung  apices clear bilaterally. Review of the MIP images confirms the above findings CTA HEAD FINDINGS Anterior circulation: Mild atherosclerotic disease in the cavernous carotid bilaterally. No significant stenosis or aneurysm. Anterior and middle cerebral arteries patent bilaterally without stenosis. Early draining veins are noted in the right temporoparietal lobe. These appear to be arising from middle cerebral artery branches and extend to the cortex laterally. No aneurysm identified. No definite AVM identified. These abnormal veins are just below the large hematoma. Posterior circulation: Left vertebral artery supplies the basilar. Mild stenosis distal left vertebral artery. Basilar widely patent. Superior cerebellar and posterior cerebral arteries patent bilaterally without stenosis or vascular malformation. Venous sinuses: Normal venous enhancement Anatomic variants: None Review of the MIP images confirms the above findings IMPRESSION: 1. Large hematoma in the right posterior frontal lobe shows interval increase in size from approximately 20 minutes prior. There is contrast within the hematoma compatible with active bleeding. There is increased mass-effect and midline shift which is now 8 mm towards the left. 2. CT angiogram demonstrates abnormal draining veins in the right temporoparietal lobe just below the hematoma compatible with a vascular malformation. No definite AVM is identified. This may be a fistula. No aneurysm. 3. No significant carotid or vertebral artery stenosis in the neck. Right vertebral artery ends in PICA. 4. These results were called by telephone at the time of interpretation on 10/08/2021 at 5:48 pm to provider [facility] , who verbally acknowledged these results. Electronically Signed   By: Marlan Palau M.D.   On: October 08, 2021 17:50   CT HEAD WO CONTRAST ( )  Addendum Date: 08-Oct-2021   ADDENDUM REPORT: October 08, 2021 20:06 ADDENDUM: These results were called by telephone at the time of  interpretation on 08-Oct-2021 at 8:05 pm to provider [facility] , who verbally acknowledged these results. Electronically Signed   By: Marlan Palau M.D.   On: 10-08-21 20:06   Addendum Date: 10-08-2021   ADDENDUM REPORT: 10-08-21 20:01 ADDENDUM: These results were called by telephone at the time of interpretation on 10/08/2021 at 8:00 pm to provider Wilford Corner, who verbally acknowledged these results. Electronically Signed   By: Marlan Palau M.D.   On: 10-08-21 20:01   Result Date: 2021-10-08 CLINICAL DATA:  Follow-up intracranial hemorrhage. EXAM: CT HEAD WITHOUT CONTRAST TECHNIQUE: Contiguous axial images were obtained from the base of the skull through the vertex without intravenous contrast. RADIATION DOSE REDUCTION: This exam was performed according to the departmental dose-optimization program which includes automated exposure control, adjustment of the mA and/or kV according to patient size and/or use of iterative reconstruction technique. COMPARISON:  CT head approximately 2-1/2 hours ago. FINDINGS: Brain: Large hematoma in the right cerebral hemisphere continues to enlarge. High density is present within the hematoma most likely a combination of blood and extravasated contrast from prior CT angiogram. Hematoma now measures approximately 8.5 x 6.0 x 6.3 cm. Hematoma volume has increased to 158 mL. Progressive mass-effect and midline shift which now measures 17 mm. There is compression of the right lateral ventricle with mild trapping of the left lateral ventricle. Left temporal horn mildly dilated compared  to the prior study. Small amount of subarachnoid hemorrhage around the hematoma unchanged. Vascular: Enhancing vessels from prior contrast injection. Skull: Negative Sinuses/Orbits: Mucosal edema paranasal sinuses.  Negative orbit Other: None IMPRESSION: Large right hemispheric hematoma continues to enlarge. There is a combination of acute hemorrhage and extravasated contrast within the hematoma  which now measures 185 mL. There is progressive mass-effect and midline shift which now measures 17 mm. There is mild trapping of the left lateral ventricle. Electronically Signed: By: Marlan Palau M.D. On: 2021/10/26 19:57   Portable Chest x-ray  Result Date: 10/26/21 CLINICAL DATA:  Left-sided weakness. EXAM: PORTABLE CHEST 1 VIEW COMPARISON:  Chest x-ray Oct 26, 2021. FINDINGS: Endotracheal tube tip is 2.1 cm above the carina. Heart is enlarged. There is a band like opacity in the retrocardiac region. Costophrenic angles are clear. No pneumothorax or acute fracture. IMPRESSION: 1. Band of opacity in the retrocardiac lesion favored is atelectasis. Infection or aspiration not excluded. 2. Stable cardiomegaly. Electronically Signed   By: Darliss Cheney M.D.   On: 2021/10/26 19:02   DG Chest Port 1 View  Result Date: 26-Oct-2021 CLINICAL DATA:  Chest pain EXAM: PORTABLE CHEST 1 VIEW COMPARISON:  02/18/2017 FINDINGS: Cardiomegaly, vascular congestion. No confluent opacities, effusions or edema. No acute bony abnormality. IMPRESSION: Cardiomegaly, vascular congestion. Electronically Signed   By: Charlett Nose M.D.   On: 10/26/2021 18:09   CT VENOGRAM HEAD  Result Date: October 26, 2021 CLINICAL DATA:  Intracranial hemorrhage. EXAM: CT VENOGRAM HEAD TECHNIQUE: Venographic phase images of the brain were obtained following the administration of intravenous contrast. Multiplanar reformats and maximum intensity projections were generated. RADIATION DOSE REDUCTION: This exam was performed according to the departmental dose-optimization program which includes automated exposure control, adjustment of the mA and/or kV according to patient size and/or use of iterative reconstruction technique. CONTRAST:  71mL OMNIPAQUE IOHEXOL 350 MG/ML SOLN COMPARISON:  CT head proximally 20 minutes prior FINDINGS: Large hematoma in the right posterior temporal lobe shows active extravasation of contrast and enlargement of the hematoma.  Increased mass-effect and midline shift. Midline shift now 8 mm. Mild amount of subarachnoid hemorrhage. No intraventricular hemorrhage. Superior sagittal sinus is widely patent without thrombosis. Straight sinus patent. Transverse sinus and sigmoid sinus widely patent bilaterally. IMPRESSION: 1. Negative for dural venous thrombosis. 2. Active extravasation of contrast in the right sided hematoma which has enlarged with increased mass-effect. 3. These results were called by telephone at the time of interpretation on 26-Oct-2021 at 5:52 pm to provider Wilford Corner , who verbally acknowledged these results. Electronically Signed   By: Marlan Palau M.D.   On: 26-Oct-2021 17:53   CT HEAD CODE STROKE WO CONTRAST  Result Date: 26-Oct-2021 CLINICAL DATA:  Code stroke. Acute neuro deficit. Left-sided weakness. EXAM: CT HEAD WITHOUT CONTRAST TECHNIQUE: Contiguous axial images were obtained from the base of the skull through the vertex without intravenous contrast. RADIATION DOSE REDUCTION: This exam was performed according to the departmental dose-optimization program which includes automated exposure control, adjustment of the mA and/or kV according to patient size and/or use of iterative reconstruction technique. COMPARISON:  CT head 02/18/2017 FINDINGS: Brain: Large hemorrhage in the right posterior frontal lobe measuring approximately 6.2 x 5.7 x 5.2 cm. Estimated hematoma volume 60 mL of blood. Small amount of adjacent subarachnoid hemorrhage. There is surrounding edema and local mass-effect. 5.5 mm of midline shift to the left. No intraventricular hemorrhage . Ventricle size normal. No acute infarct or mass. Vascular: Negative for hyperdense vessel Skull: Negative Sinuses/Orbits: Paranasal sinuses clear.  Negative orbit Other: None ASPECTS ([place] Stroke Program Early CT Score) Aspect score not obtained due to intracranial hemorrhage. IMPRESSION: 1. Large volume acute hemorrhage right posterior frontal lobe with local  mass-effect and 5.5 mm midline shift. Hematoma volume 60 mL. Small amount of subarachnoid hemorrhage. No underlying infarct or tumor identified. This may represent hypertensive hemorrhage or cerebral amyloid hemorrhage. 2. These results were called by telephone at the time of interpretation on 10-31-2021 at 5:09 pm to provider Wilford Corner , who verbally acknowledged these results. Electronically Signed   By: Marlan Palau M.D.   On: October 31, 2021 17:10    PHYSICAL EXAM General:  Well-nourished, well developed male resting comfortably.  ASSESSMENT/PLAN Mr. RUVIM RISKO is a 61 y.o. male with history of HTN, HLD, T2DM, SAH, CAD and ICH presenting with with slurred speech, right gaze deviation, left sided weakness and left sided neglect.  His family reported that he was in his usual state of health until he awoke from a nap at 1530 and c/o slurred speech.  EMS was called, and patient presented to the ED.  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  Patient then decompensated and required intubation.  Neurosurgery was consulted, but felt that in the setting of this devastating injury that interventions would not be helpful.  Repeat head CT showed enlargement of ICH.  After discussion with patient's family, he was extubated and  transitioned to comfort care.  ICH:  right posterior frontal lobe Code Stroke CT head large volume acute hemorrhage in right posterior frontal lobe with mass effect and 5.5 mm midline shift CTA head & neck increase in size of right posterior frontal lobe hematoma with contrast in hematoma, abnormal draining veins in right temporoparietal lobe compatible with vascular malformation CT venogram negative for dural venous thrombosis, active extravasation of contrast in right sided hematoma which has enlarged. Repeat head CT large right hemispheric hematoma continues to enlarge, measuring 185 mL, progressive mass effect ad 17 mm midline shift. LDL  62 HgbA1c 7.8 VTE prophylaxis - n/a, patient on comfort care    There are no active orders of the following types: Diet, Nourishments.   aspirin 81 mg daily and clopidogrel 75 mg daily prior to admission, now on No antithrombotic secondary to ICH Therapy recommendations:  n/a Disposition:  comfort care  Hypertension Home meds:  losartan 25 mg daily Stable Long-term BP goal normotensive  Hyperlipidemia Home meds:  none LDL 62, goal < 70 High intensity statin not indicated as patient is on comfort care Continue statin at discharge  Diabetes type II Uncontrolled Home meds:  jardiance 25 mg daily, metformin 1000 mg daily HgbA1c 7.8, goal < 7.0 CBGs Recent Labs    10/31/2021 1649  GLUCAP 181*    SSI  Comfort care measures in place Morphine gtt PRN morphine boluses Support from chaplain as requested by family  Other Stroke Risk Factors Former cigarette smoker Hx ICH   Other Active Problems none  Hospital day # 1  Cortney E Ernestina Columbia , MSN, AGACNP-BC [facility] See Amion for schedule and pager information 10/04/2021 1:20 PM   ATTENDING NOTE: I reviewed above note and agree with the assessment and plan. Pt was seen and examined.   61 year old male admitted for large right hemisphere ICH, IVH with significant midline shift and brain herniation.  Neurosurgery consulted yesterday not a candidate for surgery.  Patient large ICH not survivable, discussed with family, they requested comfort  care measures.  Currently patient unresponsive, in comfort care measures.  No transferred to 6 N.  For detailed assessment and plan, please refer to above as I have made changes wherever appropriate.   Marvel PlanJindong Orphia Mctigue, MD PhD Stroke Neurology 10/04/2021 6:21 PM    To contact Stroke Continuity provider, please refer to [email or web address]. After hours, contact General Neurology

## 2021-10-04 NOTE — Progress Notes (Incomplete)
STUDENT FOLLOW-UP NOTES - DISREGARD ° ° ° ° ° ° ° ° ° ° ° ° ° °CC/HPI:  ° ° °PMH:  ° °Dx:  ° ° °PLAN:  ° ° °Anticoag:  ° °ID:  ° °Antimicrobials this admission:  °*** *** >> *** °*** *** >> *** ° °Microbiology results: °*** BCx: *** °*** UCx: ***  °*** Sputum: ***  °*** Resp. Panel: *** °*** MRSA PCR: *** ° °CV:  ° °Endocrine:  ° °GI/Nutrition:  ° °Neuro:  ° °Renal:  ° °Pulm:  ° °Heme/Onc:  ° °PTA Med Issues:  ° °Additional Notes:   °

## 2021-10-10 NOTE — Care Management Important Message (Signed)
Important Message  Patient Details  Name: Bryan Rowland MRN: [id number] Date of Birth: 1961/05/21   Medicare Important Message Given:  Other (see comment)     Sherilyn Banker 2021-10-25, 12:30 PM

## 2021-10-10 NOTE — Progress Notes (Signed)
We spoke to the family at length yesterday and paid our respects.  We are sorry that we cannot do more to change the ultimate outcome here.  We will sign off but please call us if we can be of any further assistance with this gentleman and his family

## 2021-10-10 NOTE — Death Summary Note (Signed)
DEATH SUMMARY   Patient Details  Name: Bryan Rowland MRN: [id number] DOB: 11/30/1960  Admission/Discharge Information   Admit Date:  Oct 25, 2021  Date of Death: Date of Death: 10-27-2021  Time of Death: Time of Death: 1009  Length of Stay: 2  Referring Physician: Pcp, No   Reason(s) for Hospitalization  ICH  Diagnoses  Preliminary cause of death:  ICH  Brain herniation  Secondary Diagnoses (including complications and co-morbidities):  HTN HLD DM CAD Hx of ICH/SAH   Brief Hospital Course (including significant findings, care, treatment, and services provided and events leading to death)  Bryan Rowland is a 61 y.o. year old male with history of HTN, HLD, T2DM, SAH, CAD and ICH presenting with with slurred speech, right gaze deviation, left sided weakness and left sided neglect.  His family reported that he was in his usual state of health until he awoke from a nap at 1530 and c/o slurred speech.  EMS was called, and patient presented to the ED.  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  Patient then decompensated and required intubation.  Neurosurgery was consulted, but felt that in the setting of this devastating injury that interventions would not be helpful.  Repeat head CT showed enlargement of ICH.  After discussion with patient's family, he was extubated and  transitioned to comfort care. Pt passed way at 1009 on 10/27/2021.   Pertinent Labs and Studies  Significant Diagnostic Studies CT ANGIO HEAD NECK W WO CM  Result Date: 10/25/2021 CLINICAL DATA:  Intracranial hemorrhage EXAM: CT ANGIOGRAPHY HEAD AND NECK TECHNIQUE: Multidetector CT imaging of the head and neck was performed using the standard protocol during bolus administration of intravenous contrast. Multiplanar CT image reconstructions and MIPs were obtained to evaluate the vascular anatomy. Carotid stenosis measurements (when applicable) are obtained utilizing NASCET  criteria, using the distal internal carotid diameter as the denominator. RADIATION DOSE REDUCTION: This exam was performed according to the departmental dose-optimization program which includes automated exposure control, adjustment of the mA and/or kV according to patient size and/or use of iterative reconstruction technique. CONTRAST:  60mL OMNIPAQUE IOHEXOL 350 MG/ML SOLN COMPARISON:  CT head 10-25-2021 FINDINGS: CT HEAD FINDINGS Postcontrast scanning was performed of the head following the CT angiogram. There is interval progression of hemorrhage in the right posterior frontal lobe. There is high-density contrast within the hematoma medially and posteriorly. Hematoma now measures 6.6 x 3.8 cm. Increased mass-effect and midline shift which is now 8 mm. Small amount of subarachnoid hemorrhage again noted. Review of the MIP images confirms the above findings CTA NECK FINDINGS Aortic arch: Mild atherosclerotic calcification aortic arch. Proximal great vessels widely patent. Left vertebral artery origin from the arch. Right carotid system: Mild atherosclerotic disease right carotid bifurcation without stenosis. Left carotid system: Left carotid bifurcation widely patent without atherosclerotic disease. Small calcification left internal carotid artery below the skull base without significant stenosis Vertebral arteries: Left vertebral artery dominant and arising from the aortic arch. Left vertebral artery is patent to the skull base without stenosis. Mild stenosis at the foramen magnum. Non dominant right vertebral artery is small and ends as PICA. Skeleton: No acute skeletal abnormality. Other neck: Negative for mass or adenopathy Upper chest: Lung apices clear bilaterally. Review of the MIP images confirms the above findings CTA HEAD FINDINGS Anterior circulation: Mild atherosclerotic disease in the cavernous carotid bilaterally. No significant stenosis or aneurysm. Anterior and middle cerebral arteries patent  bilaterally without stenosis. Early draining veins are noted in the right temporoparietal lobe. These appear to be arising from middle cerebral artery branches and extend to the cortex laterally. No aneurysm identified. No definite AVM identified. These abnormal veins are just below the large hematoma. Posterior circulation: Left vertebral artery supplies the basilar. Mild stenosis distal left vertebral artery. Basilar widely patent. Superior cerebellar and posterior cerebral arteries patent bilaterally without stenosis or vascular malformation. Venous sinuses: Normal venous enhancement Anatomic variants: None Review of the MIP images confirms the above findings IMPRESSION: 1. Large hematoma in the right posterior frontal lobe shows interval increase in size from approximately 20 minutes prior. There is contrast within the hematoma compatible with active bleeding. There is increased mass-effect and midline shift which is now 8 mm towards the left. 2. CT angiogram demonstrates abnormal draining veins in the right temporoparietal lobe just below the hematoma compatible with a vascular malformation. No definite AVM is identified. This may be a fistula. No aneurysm. 3. No significant carotid or vertebral artery stenosis in the neck. Right vertebral artery ends in PICA. 4. These results were called by telephone at the time of interpretation on 08-28-22 at 5:48 pm to provider [facility] Care[facility]ISH ARORA , who verbally acknowledged these results. Electronically Signed   By: Marlan Palauharles  Clark M.D.   On: 012-20-23 17:50   CT HEAD WO CONTRAST (5MM)  Addendum Date: 08-28-22   ADDENDUM REPORT: 012-20-23 20:06 ADDENDUM: These results were called by telephone at the time of interpretation on 08-28-22 at 8:05 pm to provider [facility] , who verbally acknowledged these results. Electronically Signed   By: Marlan Palauharles  Clark M.D.   On: 012-20-23 20:06   Addendum Date: 08-28-22   ADDENDUM REPORT: 012-20-23 20:01 ADDENDUM: These  results were called by telephone at the time of interpretation on 08-28-22 at 8:00 pm to provider Wilford CornerArora, who verbally acknowledged these results. Electronically Signed   By: Marlan Palauharles  Clark M.D.   On: 012-20-23 20:01   Result Date: 08-28-22 CLINICAL DATA:  Follow-up intracranial hemorrhage. EXAM: CT HEAD WITHOUT CONTRAST TECHNIQUE: Contiguous axial images were obtained from the base of the skull through the vertex without intravenous contrast. RADIATION DOSE REDUCTION: This exam was performed according to the departmental dose-optimization program which includes automated exposure control, adjustment of the mA and/or kV according to patient size and/or use of iterative reconstruction technique. COMPARISON:  CT head approximately 2-1/2 hours ago. FINDINGS: Brain: Large hematoma in the right cerebral hemisphere continues to enlarge. High density is present within the hematoma most likely a combination of blood and extravasated contrast from prior CT angiogram. Hematoma now measures approximately 8.5 x 6.0 x 6.3 cm. Hematoma volume has increased to 158 mL. Progressive mass-effect and midline shift which now measures 17 mm. There is compression of the right lateral ventricle with mild trapping of the left lateral ventricle. Left temporal horn mildly dilated compared to the prior study. Small amount of subarachnoid hemorrhage around the hematoma unchanged. Vascular: Enhancing vessels from prior contrast injection. Skull: Negative Sinuses/Orbits: Mucosal edema paranasal sinuses.  Negative orbit Other: None IMPRESSION: Large right hemispheric hematoma continues to enlarge. There is a combination of acute hemorrhage and extravasated contrast within the hematoma which now measures 185 mL. There is progressive mass-effect and midline shift which now measures 17 mm. There is mild trapping of the left lateral ventricle. Electronically Signed: By: Marlan Palauharles  Clark M.D. On: 012-20-23 19:57   Portable Chest x-ray  Result  Date: 08-28-22 CLINICAL DATA:  Left-sided weakness. EXAM: PORTABLE  CHEST 1 VIEW COMPARISON:  Chest x-ray October 21, 2021. FINDINGS: Endotracheal tube tip is 2.1 cm above the carina. Heart is enlarged. There is a band like opacity in the retrocardiac region. Costophrenic angles are clear. No pneumothorax or acute fracture. IMPRESSION: 1. Band of opacity in the retrocardiac lesion favored is atelectasis. Infection or aspiration not excluded. 2. Stable cardiomegaly. Electronically Signed   By: Darliss Cheney M.D.   On: October 21, 2021 19:02   DG Chest Port 1 View  Result Date: 21-Oct-2021 CLINICAL DATA:  Chest pain EXAM: PORTABLE CHEST 1 VIEW COMPARISON:  02/18/2017 FINDINGS: Cardiomegaly, vascular congestion. No confluent opacities, effusions or edema. No acute bony abnormality. IMPRESSION: Cardiomegaly, vascular congestion. Electronically Signed   By: Charlett Nose M.D.   On: Oct 21, 2021 18:09   CT VENOGRAM HEAD  Result Date: 10/21/2021 CLINICAL DATA:  Intracranial hemorrhage. EXAM: CT VENOGRAM HEAD TECHNIQUE: Venographic phase images of the brain were obtained following the administration of intravenous contrast. Multiplanar reformats and maximum intensity projections were generated. RADIATION DOSE REDUCTION: This exam was performed according to the departmental dose-optimization program which includes automated exposure control, adjustment of the mA and/or kV according to patient size and/or use of iterative reconstruction technique. CONTRAST:  14mL OMNIPAQUE IOHEXOL 350 MG/ML SOLN COMPARISON:  CT head proximally 20 minutes prior FINDINGS: Large hematoma in the right posterior temporal lobe shows active extravasation of contrast and enlargement of the hematoma. Increased mass-effect and midline shift. Midline shift now 8 mm. Mild amount of subarachnoid hemorrhage. No intraventricular hemorrhage. Superior sagittal sinus is widely patent without thrombosis. Straight sinus patent. Transverse sinus and sigmoid sinus widely  patent bilaterally. IMPRESSION: 1. Negative for dural venous thrombosis. 2. Active extravasation of contrast in the right sided hematoma which has enlarged with increased mass-effect. 3. These results were called by telephone at the time of interpretation on 10-21-2021 at 5:52 pm to provider Wilford Corner , who verbally acknowledged these results. Electronically Signed   By: Marlan Palau M.D.   On: 10/21/21 17:53   CT HEAD CODE STROKE WO CONTRAST  Result Date: 10-21-2021 CLINICAL DATA:  Code stroke. Acute neuro deficit. Left-sided weakness. EXAM: CT HEAD WITHOUT CONTRAST TECHNIQUE: Contiguous axial images were obtained from the base of the skull through the vertex without intravenous contrast. RADIATION DOSE REDUCTION: This exam was performed according to the departmental dose-optimization program which includes automated exposure control, adjustment of the mA and/or kV according to patient size and/or use of iterative reconstruction technique. COMPARISON:  CT head 02/18/2017 FINDINGS: Brain: Large hemorrhage in the right posterior frontal lobe measuring approximately 6.2 x 5.7 x 5.2 cm. Estimated hematoma volume 60 mL of blood. Small amount of adjacent subarachnoid hemorrhage. There is surrounding edema and local mass-effect. 5.5 mm of midline shift to the left. No intraventricular hemorrhage . Ventricle size normal. No acute infarct or mass. Vascular: Negative for hyperdense vessel Skull: Negative Sinuses/Orbits: Paranasal sinuses clear.  Negative orbit Other: None ASPECTS ([place] Stroke Program Early CT Score) Aspect score not obtained due to intracranial hemorrhage. IMPRESSION: 1. Large volume acute hemorrhage right posterior frontal lobe with local mass-effect and 5.5 mm midline shift. Hematoma volume 60 mL. Small amount of subarachnoid hemorrhage. No underlying infarct or tumor identified. This may represent hypertensive hemorrhage or cerebral amyloid hemorrhage. 2. These results were called by telephone at  the time of interpretation on 10/21/21 at 5:09 pm to provider Wilford Corner , who verbally acknowledged these results. Electronically Signed   By: Marlan Palau M.D.   On: 2021/10/21 17:10  Microbiology Recent Results (from the past 240 hour(s))  Resp Panel by RT-PCR (Flu A&B, Covid) Nasopharyngeal Swab     Status: None   Collection Time: 09/30/2021  5:44 PM   Specimen: Nasopharyngeal Swab; Nasopharyngeal(NP) swabs in vial transport medium  Result Value Ref Range Status   SARS Coronavirus 2 by RT PCR NEGATIVE NEGATIVE Final    Comment: (NOTE) SARS-CoV-2 target nucleic acids are NOT DETECTED.  The SARS-CoV-2 RNA is generally detectable in upper respiratory specimens during the acute phase of infection. The lowest concentration of SARS-CoV-2 viral copies this assay can detect is 138 copies/mL. A negative result does not preclude SARS-Cov-2 infection and should not be used as the sole basis for treatment or other patient management decisions. A negative result may occur with  improper specimen collection/handling, submission of specimen other than nasopharyngeal swab, presence of viral mutation(s) within the areas targeted by this assay, and inadequate number of viral copies(<138 copies/mL). A negative result must be combined with clinical observations, patient history, and epidemiological information. The expected result is Negative.  Fact Sheet for Patients:  BloggerCourse.com  Fact Sheet for Healthcare Providers:  SeriousBroker.it  This test is no t yet approved or cleared by the [place] FDA and  has been authorized for detection and/or diagnosis of SARS-CoV-2 by FDA under an Emergency Use Authorization (EUA). This EUA will remain  in effect (meaning this test can be used) for the duration of the COVID-19 declaration under Section 564(b)(1) of the Act, 21 U.S.C.section 360bbb-3(b)(1), unless the authorization is terminated  or  revoked sooner.       Influenza A by PCR NEGATIVE NEGATIVE Final   Influenza B by PCR NEGATIVE NEGATIVE Final    Comment: (NOTE) The Xpert Xpress SARS-CoV-2/FLU/RSV plus assay is intended as an aid in the diagnosis of influenza from Nasopharyngeal swab specimens and should not be used as a sole basis for treatment. Nasal washings and aspirates are unacceptable for Xpert Xpress SARS-CoV-2/FLU/RSV testing.  Fact Sheet for Patients: BloggerCourse.com  Fact Sheet for Healthcare Providers: SeriousBroker.it  This test is not yet approved or cleared by the [place] FDA and has been authorized for detection and/or diagnosis of SARS-CoV-2 by FDA under an Emergency Use Authorization (EUA). This EUA will remain in effect (meaning this test can be used) for the duration of the COVID-19 declaration under Section 564(b)(1) of the Act, 21 U.S.C. section 360bbb-3(b)(1), unless the authorization is terminated or revoked.  Performed at [facility], [address] [place], [place] [address]   MRSA Next Gen by PCR, Nasal     Status: None   Collection Time: 09/15/2021 10:51 PM   Specimen: Nasal Mucosa; Nasal Swab  Result Value Ref Range Status   MRSA by PCR Next Gen NOT DETECTED NOT DETECTED Final    Comment: (NOTE) The GeneXpert MRSA Assay (FDA approved for NASAL specimens only), is one component of a comprehensive MRSA colonization surveillance program. It is not intended to diagnose MRSA infection nor to guide or monitor treatment for MRSA infections. Test performance is not FDA approved in patients less than 58 years old. Performed at [facility], [address], [place], [place] [address]     Lab Basic Metabolic Panel: Recent Labs  Lab 09/20/2021 1653 09/29/2021 1655  NA 137 140  K 4.2 4.0  CL 105 105  CO2 21*  --   GLUCOSE 174* 171*  BUN 18 23  CREATININE 0.98 0.90  CALCIUM 9.2  --  Liver Function  Tests: Recent Labs  Lab 09/25/2021 1653  AST 29  ALT 21  ALKPHOS 76  BILITOT 1.0  PROT 6.7  ALBUMIN 4.1   No results for input(s): LIPASE, AMYLASE in the last 168 hours. No results for input(s): AMMONIA in the last 168 hours. CBC: Recent Labs  Lab 09/30/2021 1653 10/02/2021 1655  WBC 7.2  --   NEUTROABS 4.4  --   HGB 16.1 16.7  HCT 48.7 49.0  MCV 88.4  --   PLT 266  --    Cardiac Enzymes: No results for input(s): CKTOTAL, CKMB, CKMBINDEX, TROPONINI in the last 168 hours. Sepsis Labs: Recent Labs  Lab 10/02/2021 1653  WBC 7.2    Procedures/Operations  none   Charlesetta Milliron Jan 20, 2022, 1:30 PM

## 2021-10-10 NOTE — TOC CAGE-AID Note (Signed)
Transition of Care [facility]) - CAGE-AID Screening   Patient Details  Name: Bryan Rowland MRN: [id number] Date of Birth: 04/27/1961  Transition of Care [facility]) CM/SW Contact:    Celita Aron C Tarpley-Carter, LCSWA Phone Number: 2021-10-14, 8:40 AM   Clinical Narrative: Pt is unable to participate in Cage Aid.  Timarion Agcaoili Tarpley-Carter, MSW, LCSW-A Pronouns:  She/Her/Hers [place] Transitions of Care Clinical Social Worker Direct Number:  [phone] [email or web address]  CAGE-AID Screening: Substance Abuse Screening unable to be completed due to: : Patient unable to participate             Substance Abuse Education Offered: No

## 2021-10-10 NOTE — Progress Notes (Signed)
1/27 IM Letter not given to patient  or family. Patient expired and family was grieving in room.

## 2021-10-10 DEATH — deceased

## 2023-12-27 IMAGING — CT CT HEAD W/O CM
2 of 4 series · 13 of 47 positions shown, 16 images · non-contrast
Comparison: CT head approximately 2-1/2 hours ago.
COMPARISON: CT head approximately 2-1/2 hours ago.
COMPARISON: CT head approximately 2-1/2 hours ago.

Addendum:
CLINICAL DATA: Follow-up intracranial hemorrhage.



[Series 3: head 5.0 h30s · axial · 0.42mm/px · z∈[-134,-9]mm · 10 of 31 slices shown, 13 images]
[im 3/31  brain]
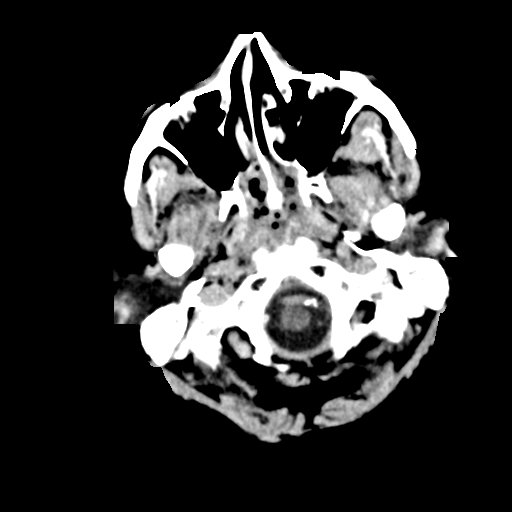
[im 3/31  bone]
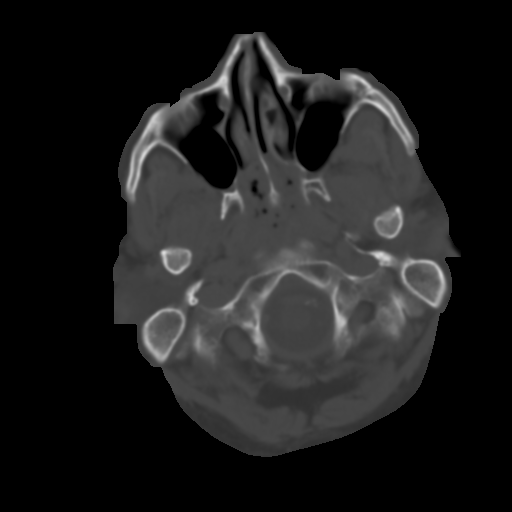
[im 5/31  brain]
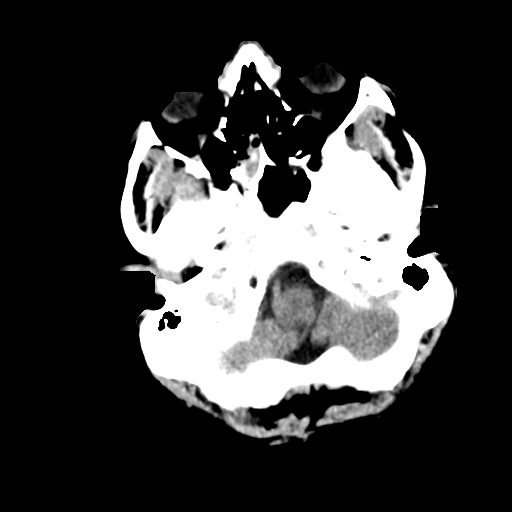
[im 9/31  brain]
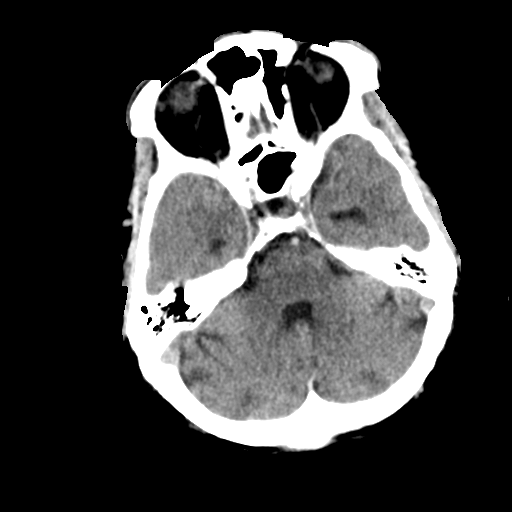
[im 11/31  brain]
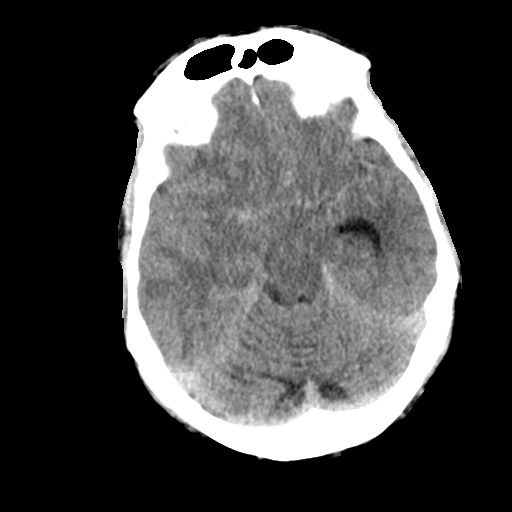
[im 13/31  brain]
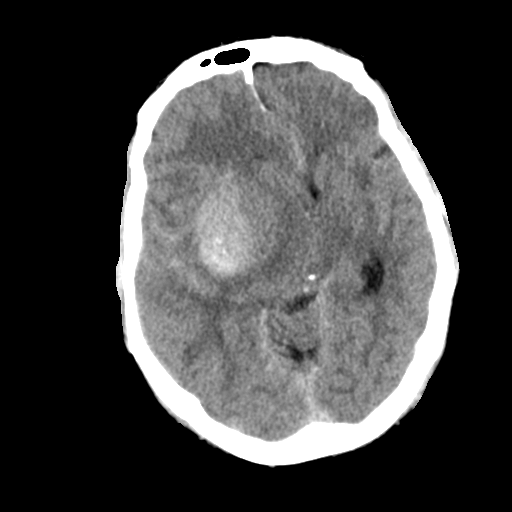
[im 13/31  bone]
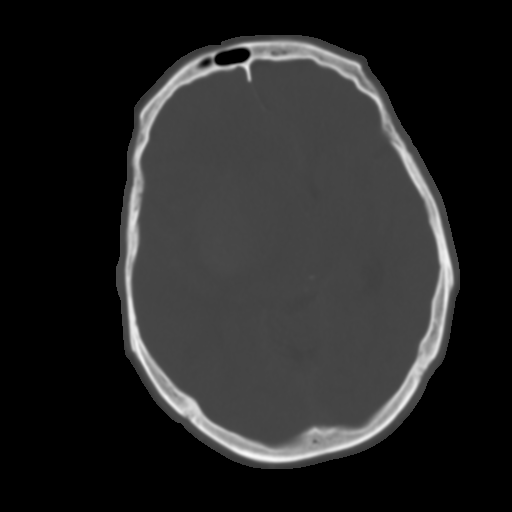
[im 18/31  brain]
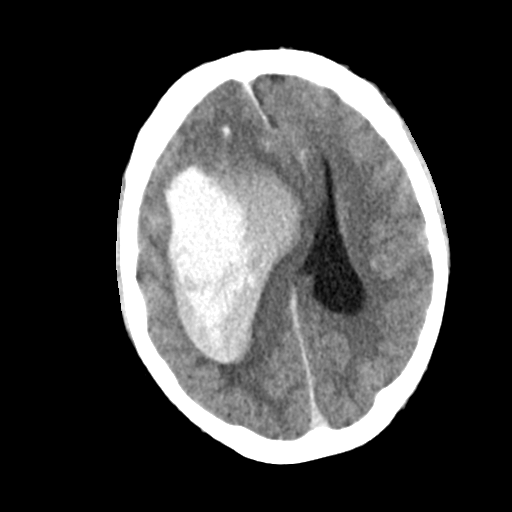
[im 20/31  brain]
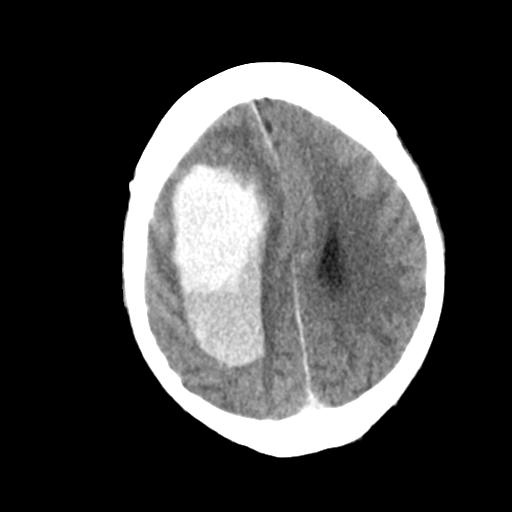
[im 22/31  brain]
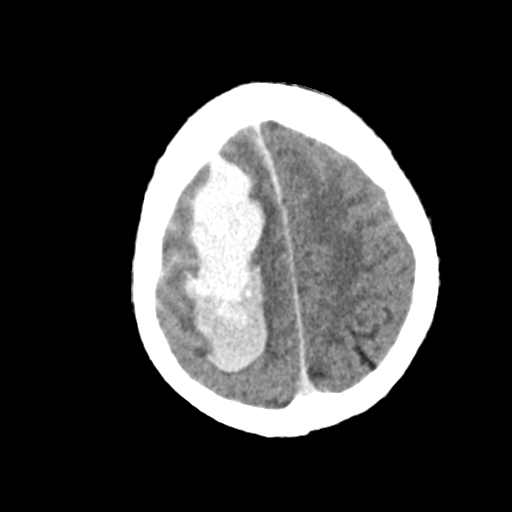
[im 26/31  brain]
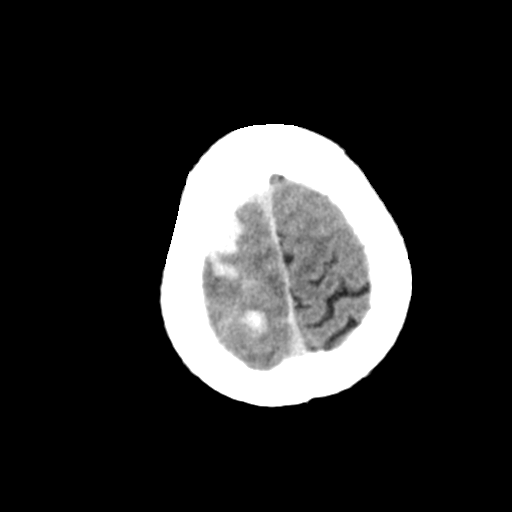
[im 26/31  bone]
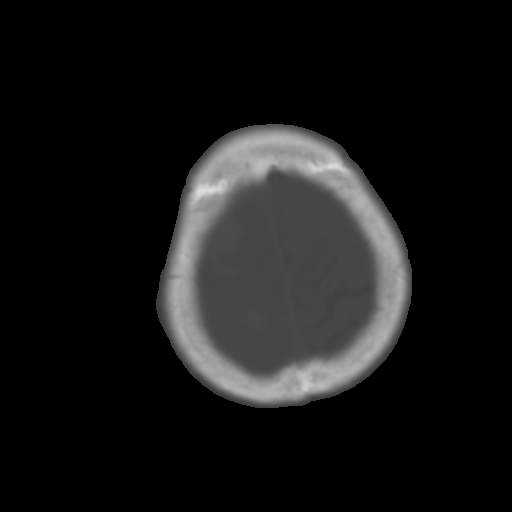
[im 28/31  brain]
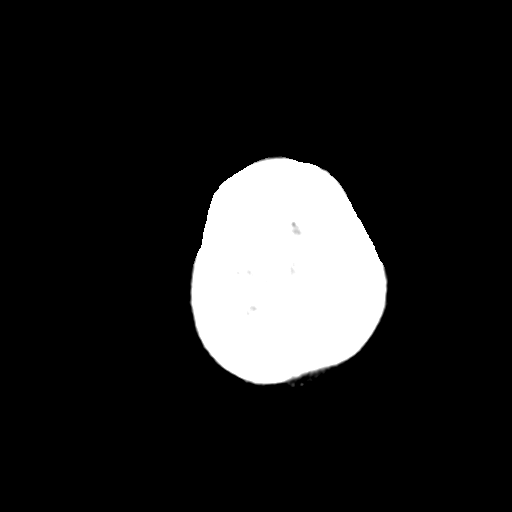

[Series 5: head 3.0 mpr cor · coronal · 0.31mm/px · 3 of 71 slices shown]
[im 24/71  brain]
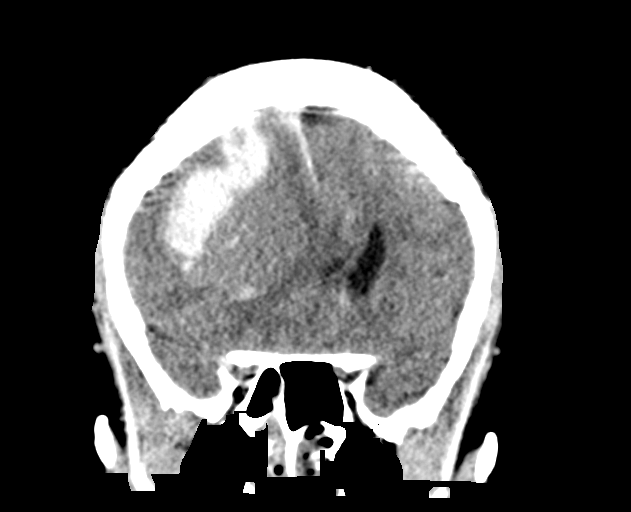
[im 32/71  brain]
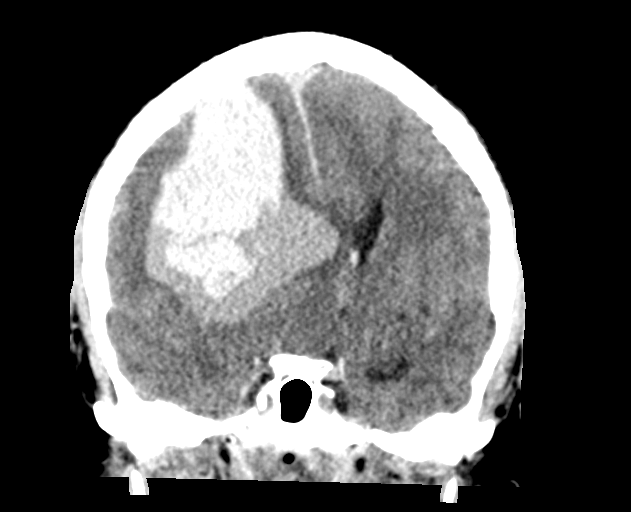
[im 39/71  brain]
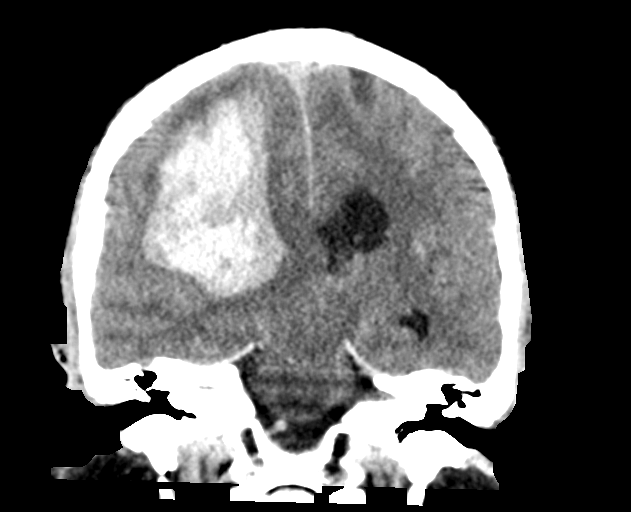

[13 of 47 positions shown; findings below may reference images not displayed]

FINDINGS: Brain: Large hematoma in the right cerebral hemisphere continues to
enlarge. High density is present within the hematoma most likely a
combination of blood and extravasated contrast from prior CT
angiogram. Hematoma now measures approximately 8.5 x 6.0 x 6.3 cm.
Hematoma volume has increased to 158 mL. Progressive mass-effect and
midline shift which now measures 17 mm. There is compression of the
right lateral ventricle with mild trapping of the left lateral
ventricle. Left temporal horn mildly dilated compared to the prior
study. Small amount of subarachnoid hemorrhage around the hematoma
unchanged.

Vascular: Enhancing vessels from prior contrast injection.

Skull: Negative

Sinuses/Orbits: Mucosal edema paranasal sinuses.  Negative orbit

Other: None
IMPRESSION: Large right hemispheric hematoma continues to enlarge. There is a
combination of acute hemorrhage and extravasated contrast within the
hematoma which now measures 185 mL. There is progressive mass-effect
and midline shift which now measures 17 mm. There is mild trapping
of the left lateral ventricle.

ADDENDUM:
These results were called by telephone at the time of interpretation
on 10/03/2021 at [DATE] to provider Alan, who verbally acknowledged
these results.

ADDENDUM:
These results were called by telephone at the time of interpretation
on 10/03/2021 at [DATE] to provider ALTAJ KEN , who verbally
acknowledged these results.

*** End of Addendum ***
Addendum:
FINDINGS: Brain: Large hematoma in the right cerebral hemisphere continues to
enlarge. High density is present within the hematoma most likely a
combination of blood and extravasated contrast from prior CT
angiogram. Hematoma now measures approximately 8.5 x 6.0 x 6.3 cm.
Hematoma volume has increased to 158 mL. Progressive mass-effect and
midline shift which now measures 17 mm. There is compression of the
right lateral ventricle with mild trapping of the left lateral
ventricle. Left temporal horn mildly dilated compared to the prior
study. Small amount of subarachnoid hemorrhage around the hematoma
unchanged.

Vascular: Enhancing vessels from prior contrast injection.

Skull: Negative

Sinuses/Orbits: Mucosal edema paranasal sinuses.  Negative orbit

Other: None
IMPRESSION: Large right hemispheric hematoma continues to enlarge. There is a
combination of acute hemorrhage and extravasated contrast within the
hematoma which now measures 185 mL. There is progressive mass-effect
and midline shift which now measures 17 mm. There is mild trapping
of the left lateral ventricle.

ADDENDUM:
These results were called by telephone at the time of interpretation
on 10/03/2021 at [DATE] to provider Alan, who verbally acknowledged
these results.

*** End of Addendum ***
FINDINGS: Brain: Large hematoma in the right cerebral hemisphere continues to
enlarge. High density is present within the hematoma most likely a
combination of blood and extravasated contrast from prior CT
angiogram. Hematoma now measures approximately 8.5 x 6.0 x 6.3 cm.
Hematoma volume has increased to 158 mL. Progressive mass-effect and
midline shift which now measures 17 mm. There is compression of the
right lateral ventricle with mild trapping of the left lateral
ventricle. Left temporal horn mildly dilated compared to the prior
study. Small amount of subarachnoid hemorrhage around the hematoma
unchanged.

Vascular: Enhancing vessels from prior contrast injection.

Skull: Negative

Sinuses/Orbits: Mucosal edema paranasal sinuses.  Negative orbit

Other: None
IMPRESSION: Large right hemispheric hematoma continues to enlarge. There is a
combination of acute hemorrhage and extravasated contrast within the
hematoma which now measures 185 mL. There is progressive mass-effect
and midline shift which now measures 17 mm. There is mild trapping
of the left lateral ventricle.

## 2023-12-27 IMAGING — CT CT VENOGRAM HEAD
1 of 7 series · 2 of 33 positions shown · non-contrast
Comparison: CT head proximally 20 minutes prior

CLINICAL DATA: Intracranial hemorrhage.

EXAM:
CT VENOGRAM HEAD
TECHNIQUE: Venographic phase images of the brain were obtained following the
administration of intravenous contrast. Multiplanar reformats and
maximum intensity projections were generated.

[Series 3: head with · axial · 0.41mm/px · z∈[+1365,+1417]mm · 2 of 80 slices shown]
[im 27/80  soft-tissue]
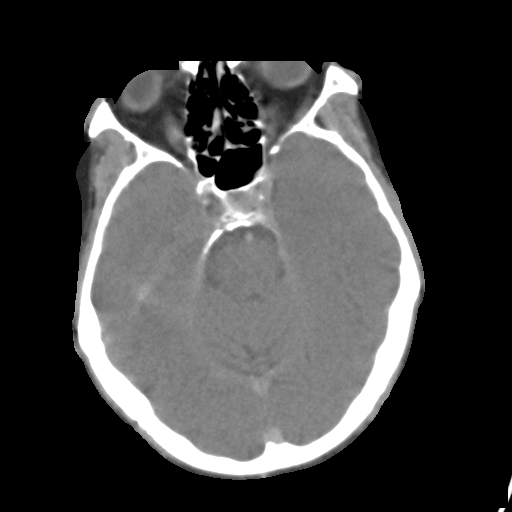
[im 53/80  bone]
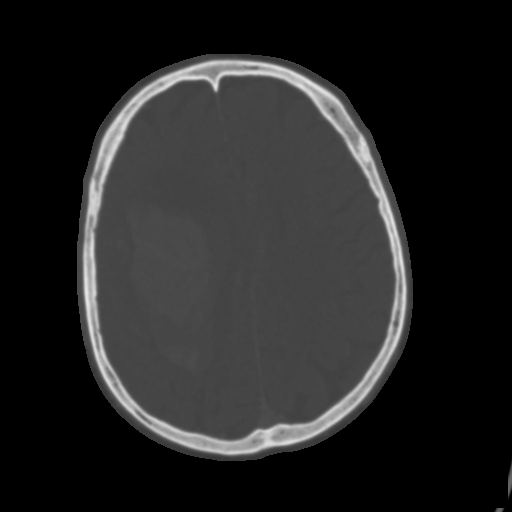

[2 of 33 positions shown; findings below may reference images not displayed]

RADIATION DOSE REDUCTION: This exam was performed according to the
departmental dose-optimization program which includes automated
exposure control, adjustment of the mA and/or kV according to
patient size and/or use of iterative reconstruction technique.

CONTRAST:  60mL OMNIPAQUE IOHEXOL 350 MG/ML SOLN
FINDINGS: Large hematoma in the right posterior temporal lobe shows active
extravasation of contrast and enlargement of the hematoma. Increased
mass-effect and midline shift. Midline shift now 8 mm. Mild amount
of subarachnoid hemorrhage. No intraventricular hemorrhage.

Superior sagittal sinus is widely patent without thrombosis.
Straight sinus patent. Transverse sinus and sigmoid sinus widely
patent bilaterally.
IMPRESSION: 1. Negative for dural venous thrombosis.
2. Active extravasation of contrast in the right sided hematoma
which has enlarged with increased mass-effect.
3. These results were called by telephone at the time of
interpretation on 10/03/2021 at [DATE] to provider Iing , who
verbally acknowledged these results.
